# Patient Record
Sex: Female | Born: 1980 | Race: Asian | Hispanic: No | Marital: Single | Smoking: Former smoker
Health system: Southern US, Community
[De-identification: ages and names within clinical notes are randomized; demographics above are authoritative.]

## PROBLEM LIST (undated history)

## (undated) DIAGNOSIS — T7840XA Allergy, unspecified, initial encounter: Secondary | ICD-10-CM

## (undated) HISTORY — DX: Allergy, unspecified, initial encounter: T78.40XA

---

## 2013-01-04 ENCOUNTER — Encounter: Payer: Self-pay | Admitting: Internal Medicine

## 2013-01-04 ENCOUNTER — Ambulatory Visit (INDEPENDENT_AMBULATORY_CARE_PROVIDER_SITE_OTHER): Payer: BC Managed Care – PPO | Admitting: Internal Medicine

## 2013-01-04 VITALS — BP 106/70 | HR 86 | Temp 98.0°F | Resp 16 | Ht 65.0 in | Wt 126.0 lb

## 2013-01-04 DIAGNOSIS — J309 Allergic rhinitis, unspecified: Secondary | ICD-10-CM

## 2013-01-04 DIAGNOSIS — Z23 Encounter for immunization: Secondary | ICD-10-CM

## 2013-01-04 MED ORDER — FLUTICASONE PROPIONATE 50 MCG/ACT NA SUSP: 2.0000 | Freq: Every day | NASAL | Status: DC

## 2013-01-04 NOTE — Patient Instructions (Signed)
Allergic Rhinitis  Allergic rhinitis is when the mucous membranes in the nose respond to allergens. Allergens are particles in the air that cause your body to have an allergic reaction. This causes you to release allergic antibodies. Through a chain of events, these eventually cause you to release histamine into the blood stream (hence the use of antihistamines). Although meant to be protective to the body, it is this release that causes your discomfort, such as frequent sneezing, congestion and an itchy runny nose.    CAUSES    The pollen allergens may come from grasses, trees, and weeds. This is seasonal allergic rhinitis, or "hay fever." Other allergens cause year-round allergic rhinitis (perennial allergic rhinitis) such as house dust mite allergen, pet dander and mold spores.    SYMPTOMS     Nasal stuffiness (congestion).   Runny, itchy nose with sneezing and tearing of the eyes.   There is often an itching of the mouth, eyes and ears.  It cannot be cured, but it can be controlled with medications.  DIAGNOSIS    If you are unable to determine the offending allergen, skin or blood testing may find it.  TREATMENT     Avoid the allergen.   Medications and allergy shots (immunotherapy) can help.   Hay fever may often be treated with antihistamines in pill or nasal spray forms. Antihistamines block the effects of histamine. There are over-the-counter medicines that may help with nasal congestion and swelling around the eyes. Check with your caregiver before taking or giving this medicine.  If the treatment above does not work, there are many new medications your caregiver can prescribe. Stronger medications may be used if initial measures are ineffective. Desensitizing injections can be used if medications and avoidance fails. Desensitization is when a patient is given ongoing shots until the body becomes less sensitive to the allergen. Make sure you follow up with your caregiver if problems continue.   SEEK MEDICAL CARE IF:     You develop fever (more than 100.5 F (38.1 C).   You develop a cough that does not stop easily (persistent).   You have shortness of breath.   You start wheezing.   Symptoms interfere with normal daily activities.  Document Released: 05/10/2001 Document Revised: 11/07/2011 Document Reviewed: 11/19/2008  [employer] Patient Information 2013 [employer].

## 2013-01-06 NOTE — Progress Notes (Signed)
  Subjective:    Patient ID: Melanie Villa, female    DOB: 25-Oct-1980, 32 y.o.   MRN: [id number]  Allergic Reaction This is a recurrent problem. The current episode started more than 1 week ago. The problem occurs intermittently. The problem has been gradually improving since onset. The problem is mild. Pertinent negatives include no abdominal pain, chest pain, chest pressure, coughing, diarrhea, difficulty breathing, drooling, eye itching, eye redness, eye watering, globus sensation, hyperventilation, itching, rash, stridor, trouble swallowing, vomiting or wheezing. There is no swelling present. Treatments tried: allerga and flonase. The treatment provided significant relief. Her past medical history is significant for seasonal allergies.      Review of Systems  Constitutional: Negative.   HENT: Positive for congestion, rhinorrhea, sneezing and postnasal drip. Negative for hearing loss, ear pain, nosebleeds, sore throat, facial swelling, drooling, mouth sores, trouble swallowing, dental problem, voice change, sinus pressure, tinnitus and ear discharge.   Eyes: Negative.  Negative for redness and itching.  Respiratory: Negative.  Negative for cough, wheezing and stridor.   Cardiovascular: Negative.  Negative for chest pain, palpitations and leg swelling.  Gastrointestinal: Negative.  Negative for vomiting, abdominal pain and diarrhea.  Endocrine: Negative.   Genitourinary: Negative.   Musculoskeletal: Negative.   Skin: Negative.  Negative for color change, itching, pallor and rash.  Allergic/Immunologic: Negative.   Neurological: Negative.   Hematological: Negative.  Negative for adenopathy. Does not bruise/bleed easily.  Psychiatric/Behavioral: Negative.        Objective:   Physical Exam  Vitals reviewed. Constitutional: She is oriented to person, place, and time. She appears well-developed and well-nourished. No distress.  HENT:  Head: Atraumatic. No trismus in the jaw.  Nose:  Nose normal. No mucosal edema, rhinorrhea, nose lacerations, sinus tenderness, nasal deformity, septal deviation or nasal septal hematoma. No epistaxis.  No foreign bodies. Right sinus exhibits no maxillary sinus tenderness and no frontal sinus tenderness. Left sinus exhibits no maxillary sinus tenderness and no frontal sinus tenderness.  Mouth/Throat: Oropharynx is clear and moist and mucous membranes are normal. Mucous membranes are not pale, not dry and not cyanotic. No edematous. No oropharyngeal exudate, posterior oropharyngeal edema, posterior oropharyngeal erythema or tonsillar abscesses.  Eyes: Conjunctivae are normal. Right eye exhibits no discharge. Left eye exhibits no discharge. No scleral icterus.  Neck: Normal range of motion. Neck supple. No JVD present. No tracheal deviation present. No thyromegaly present.  Cardiovascular: Normal rate, regular rhythm, normal heart sounds and intact distal pulses.  Exam reveals no gallop and no friction rub.   No murmur heard. Pulmonary/Chest: Effort normal and breath sounds normal. No stridor. No respiratory distress. She has no wheezes. She has no rales. She exhibits no tenderness.  Abdominal: Soft. Bowel sounds are normal. She exhibits no distension and no mass. There is no tenderness. There is no rebound and no guarding.  Musculoskeletal: Normal range of motion. She exhibits no edema and no tenderness.  Lymphadenopathy:    She has no cervical adenopathy.  Neurological: She is oriented to person, place, and time.  Skin: Skin is warm and dry. No rash noted. She is not diaphoretic. No erythema. No pallor.  Psychiatric: She has a normal mood and affect. Her behavior is normal. Judgment and thought content normal.          Assessment & Plan:

## 2013-01-06 NOTE — Assessment & Plan Note (Signed)
Continue flonase and allegra.   

## 2013-01-15 NOTE — Addendum Note (Signed)
Addended by: Rock Nephew T on: 01/15/2013 08:27 AM   Modules accepted: Orders

## 2013-03-12 ENCOUNTER — Encounter: Payer: Self-pay | Admitting: Internal Medicine

## 2013-03-12 ENCOUNTER — Other Ambulatory Visit (INDEPENDENT_AMBULATORY_CARE_PROVIDER_SITE_OTHER): Payer: BC Managed Care – PPO

## 2013-03-12 ENCOUNTER — Ambulatory Visit (INDEPENDENT_AMBULATORY_CARE_PROVIDER_SITE_OTHER): Payer: BC Managed Care – PPO | Admitting: Internal Medicine

## 2013-03-12 VITALS — BP 112/70 | HR 64 | Temp 97.8°F | Resp 16 | Wt 121.0 lb

## 2013-03-12 DIAGNOSIS — H8309 Labyrinthitis, unspecified ear: Secondary | ICD-10-CM | POA: Insufficient documentation

## 2013-03-12 DIAGNOSIS — R42 Dizziness and giddiness: Secondary | ICD-10-CM

## 2013-03-12 LAB — COMPREHENSIVE METABOLIC PANEL
ALT: 21 U/L (ref 0–35)
AST: 20 U/L (ref 0–37)
Alkaline Phosphatase: 45 U/L (ref 39–117)
CO2: 26 mEq/L (ref 19–32)
Creatinine, Ser: 0.7 mg/dL (ref 0.4–1.2)
Sodium: 139 mEq/L (ref 135–145)
Total Bilirubin: 0.5 mg/dL (ref 0.3–1.2)
Total Protein: 7.6 g/dL (ref 6.0–8.3)

## 2013-03-12 LAB — CBC WITH DIFFERENTIAL/PLATELET
Basophils Absolute: 0 10*3/uL (ref 0.0–0.1)
HCT: 47 % — ABNORMAL HIGH (ref 36.0–46.0)
Hemoglobin: 15.9 g/dL — ABNORMAL HIGH (ref 12.0–15.0)
Lymphs Abs: 2 10*3/uL (ref 0.7–4.0)
MCHC: 33.7 g/dL (ref 30.0–36.0)
MCV: 94.3 fl (ref 78.0–100.0)
Monocytes Absolute: 0.5 10*3/uL (ref 0.1–1.0)
Neutro Abs: 4.1 10*3/uL (ref 1.4–7.7)
Platelets: 252 10*3/uL (ref 150.0–400.0)
RDW: 12.3 % (ref 11.5–14.6)

## 2013-03-12 NOTE — Patient Instructions (Signed)
Vertigo Vertigo means you feel like you or your surroundings are moving when they are not. Vertigo can be dangerous if it occurs when you are at work, driving, or performing difficult activities.  CAUSES  Vertigo occurs when there is a conflict of signals sent to your brain from the visual and sensory systems in your body. There are many different causes of vertigo, including:  Infections, especially in the inner ear.  A bad reaction to a drug or misuse of alcohol and medicines.  Withdrawal from drugs or alcohol.  Rapidly changing positions, such as lying down or rolling over in bed.  A migraine headache.  Decreased blood flow to the brain.  Increased pressure in the brain from a head injury, infection, tumor, or bleeding. SYMPTOMS  You may feel as though the world is spinning around or you are falling to the ground. Because your balance is upset, vertigo can cause nausea and vomiting. You may have involuntary eye movements (nystagmus). DIAGNOSIS  Vertigo is usually diagnosed by physical exam. If the cause of your vertigo is unknown, your caregiver may perform imaging tests, such as an MRI scan (magnetic resonance imaging). TREATMENT  Most cases of vertigo resolve on their own, without treatment. Depending on the cause, your caregiver may prescribe certain medicines. If your vertigo is related to body position issues, your caregiver may recommend movements or procedures to correct the problem. In rare cases, if your vertigo is caused by certain inner ear problems, you may need surgery. HOME CARE INSTRUCTIONS   Follow your caregiver's instructions.  Avoid driving.  Avoid operating heavy machinery.  Avoid performing any tasks that would be dangerous to you or others during a vertigo episode.  Tell your caregiver if you notice that certain medicines seem to be causing your vertigo. Some of the medicines used to treat vertigo episodes can actually make them worse in some people. SEEK  IMMEDIATE MEDICAL CARE IF:   Your medicines do not relieve your vertigo or are making it worse.  You develop problems with talking, walking, weakness, or using your arms, hands, or legs.  You develop severe headaches.  Your nausea or vomiting continues or gets worse.  You develop visual changes.  A family member notices behavioral changes.  Your condition gets worse. MAKE SURE YOU:  Understand these instructions.  Will watch your condition.  Will get help right away if you are not doing well or get worse. Document Released: 05/25/2005 Document Revised: 11/07/2011 Document Reviewed: 03/03/2011 [employer] Patient Information 2014 [employer].  

## 2013-03-12 NOTE — Assessment & Plan Note (Signed)
Her exam is normal and her symptoms are improving I think she has acute viral labrynthitis - she does not want a med for symptom relief I will check her labs to look for other secondary causes

## 2013-03-12 NOTE — Progress Notes (Signed)
Subjective:    Patient ID: Melanie Villa, female    DOB: Jan 25, 1981, 32 y.o.   MRN: [id number]  HPI  She complains of a 2 day history or dizziness and nausea, the symptoms are rapidly improving.  Review of Systems  Constitutional: Negative.  Negative for fever, chills, diaphoresis, activity change, appetite change, fatigue and unexpected weight change.  HENT: Negative for ear pain, nosebleeds, congestion, facial swelling, trouble swallowing, neck pain, neck stiffness, voice change and tinnitus.   Eyes: Negative.  Negative for photophobia and visual disturbance.  Respiratory: Negative.  Negative for cough, chest tightness, shortness of breath, wheezing and stridor.   Cardiovascular: Negative.  Negative for chest pain, palpitations and leg swelling.  Gastrointestinal: Positive for nausea. Negative for vomiting, abdominal pain, diarrhea and constipation.  Endocrine: Negative.   Musculoskeletal: Negative.  Negative for myalgias, back pain, joint swelling and gait problem.  Skin: Negative.   Allergic/Immunologic: Negative.   Neurological: Positive for dizziness. Negative for tremors, seizures, syncope, facial asymmetry, speech difficulty, weakness, light-headedness, numbness and headaches.  Hematological: Negative.  Negative for adenopathy. Does not bruise/bleed easily.  Psychiatric/Behavioral: Negative.        Objective:   Physical Exam  Vitals reviewed. Constitutional: She is oriented to person, place, and time. She appears well-developed and well-nourished.  Non-toxic appearance. She does not have a sickly appearance. She does not appear ill. No distress.  HENT:  Head: Normocephalic and atraumatic. No trismus in the jaw.  Right Ear: Hearing, tympanic membrane, external ear and ear canal normal.  Left Ear: Hearing, tympanic membrane, external ear and ear canal normal.  Nose: No mucosal edema or rhinorrhea.  Mouth/Throat: Oropharynx is clear and moist and mucous membranes are  normal. Mucous membranes are not pale, not dry and not cyanotic. No oral lesions. No edematous. No oropharyngeal exudate, posterior oropharyngeal edema, posterior oropharyngeal erythema or tonsillar abscesses.  Eyes: Conjunctivae and EOM are normal. Pupils are equal, round, and reactive to light. Right eye exhibits no discharge. Left eye exhibits no discharge. No scleral icterus.  Neck: Normal range of motion and full passive range of motion without pain. Neck supple. Normal carotid pulses, no hepatojugular reflux and no JVD present. No tracheal tenderness present. Carotid bruit is not present. No edema present. No mass and no thyromegaly present.  Cardiovascular: Normal rate, regular rhythm, normal heart sounds and intact distal pulses.  Exam reveals no gallop and no friction rub.   No murmur heard. Pulmonary/Chest: Effort normal and breath sounds normal. No respiratory distress. She has no wheezes. She has no rales. She exhibits no tenderness.  Abdominal: Soft. Bowel sounds are normal. She exhibits no distension and no mass. There is no tenderness. There is no rebound and no guarding.  Musculoskeletal: Normal range of motion. She exhibits no edema and no tenderness.  Neurological: She is alert and oriented to person, place, and time. She has normal strength. She displays no atrophy, no tremor and normal reflexes. No cranial nerve deficit or sensory deficit. She exhibits normal muscle tone. She displays a negative Romberg sign. She displays no seizure activity. Coordination and gait normal. She displays no Babinski's sign on the right side. She displays no Babinski's sign on the left side.  Reflex Scores:      Tricep reflexes are 0 on the right side and 0 on the left side.      Bicep reflexes are 0 on the right side and 0 on the left side.      Brachioradialis reflexes are 0  on the right side and 0 on the left side.      Patellar reflexes are 0 on the right side and 0 on the left side.      Achilles  reflexes are 0 on the right side and 0 on the left side. Skin: Skin is warm and dry. No rash noted. She is not diaphoretic. No erythema. No pallor.  Psychiatric: She has a normal mood and affect. Her behavior is normal. Judgment and thought content normal.     No results found for this basename: WBC, HGB, HCT, PLT, GLUCOSE, CHOL, TRIG, HDL, LDLDIRECT, LDLCALC, ALT, AST, NA, K, CL, CREATININE, BUN, CO2, TSH, PSA, INR, GLUF, HGBA1C, MICROALBUR       Assessment & Plan:

## 2013-03-27 ENCOUNTER — Ambulatory Visit (INDEPENDENT_AMBULATORY_CARE_PROVIDER_SITE_OTHER): Payer: BC Managed Care – PPO | Admitting: Internal Medicine

## 2013-03-27 ENCOUNTER — Encounter: Payer: Self-pay | Admitting: Internal Medicine

## 2013-03-27 ENCOUNTER — Other Ambulatory Visit (INDEPENDENT_AMBULATORY_CARE_PROVIDER_SITE_OTHER): Payer: BC Managed Care – PPO

## 2013-03-27 VITALS — BP 118/80 | HR 64 | Temp 98.1°F | Resp 16 | Wt 123.0 lb

## 2013-03-27 DIAGNOSIS — H8309 Labyrinthitis, unspecified ear: Secondary | ICD-10-CM

## 2013-03-27 DIAGNOSIS — D45 Polycythemia vera: Secondary | ICD-10-CM

## 2013-03-27 DIAGNOSIS — D751 Secondary polycythemia: Secondary | ICD-10-CM

## 2013-03-27 DIAGNOSIS — H8303 Labyrinthitis, bilateral: Secondary | ICD-10-CM

## 2013-03-27 LAB — CBC WITH DIFFERENTIAL/PLATELET
Basophils Absolute: 0 10*3/uL (ref 0.0–0.1)
Eosinophils Relative: 0.4 % (ref 0.0–5.0)
Lymphs Abs: 1.8 10*3/uL (ref 0.7–4.0)
Monocytes Absolute: 0.5 10*3/uL (ref 0.1–1.0)
Monocytes Relative: 7.9 % (ref 3.0–12.0)
Neutrophils Relative %: 64.5 % (ref 43.0–77.0)
Platelets: 245 10*3/uL (ref 150.0–400.0)
RDW: 12.3 % (ref 11.5–14.6)
WBC: 6.9 10*3/uL (ref 4.5–10.5)

## 2013-03-27 LAB — LACTATE DEHYDROGENASE: LDH: 115 U/L (ref 94–250)

## 2013-03-27 MED ORDER — PREDNISONE 20 MG PO TABS: 20.0000 mg | ORAL_TABLET | Freq: Two times a day (BID) | ORAL | Status: AC

## 2013-03-27 NOTE — Assessment & Plan Note (Signed)
She is improving but has some persistent symptoms She no dangerous neuro s/s Will try a short course of prednisone to treat the inner ear inflammation

## 2013-03-27 NOTE — Assessment & Plan Note (Signed)
I think this was caused by mild dehydration I will recheck her CBC today and will check her LDH and erythropoetin level to see if there is a secondary cause for this

## 2013-03-27 NOTE — Progress Notes (Signed)
Subjective:    Patient ID: Melanie Villa, female    DOB: 03/28/1981, 32 y.o.   MRN: [id number]  HPI  She returns and complains of persistent but somewhat improving dizziness and vertigo. The symptoms are worsened when she changes positions or stands up quickly but she is doing all of her normal activities and she did a long run yesterday. She does not feel poorly when she is in the bed and when she changes positions in the bed.  Review of Systems  Constitutional: Negative.  Negative for activity change.  HENT: Negative.  Negative for hearing loss, ear pain, congestion, facial swelling, drooling, trouble swallowing, neck pain, neck stiffness, dental problem and tinnitus.   Eyes: Negative.   Respiratory: Negative.   Cardiovascular: Negative.  Negative for chest pain, palpitations and leg swelling.  Gastrointestinal: Negative.   Endocrine: Negative.   Genitourinary: Negative.   Musculoskeletal: Negative.   Skin: Negative.   Allergic/Immunologic: Negative.   Neurological: Positive for dizziness. Negative for tremors, seizures, syncope, facial asymmetry, speech difficulty, weakness, light-headedness, numbness and headaches.  Hematological: Negative.   Psychiatric/Behavioral: Negative.  Negative for sleep disturbance. The patient is not nervous/anxious.        Objective:   Physical Exam  Vitals reviewed. Constitutional: She is oriented to person, place, and time. She appears well-developed and well-nourished.  Non-toxic appearance. She does not have a sickly appearance. She does not appear ill. No distress.  HENT:  Head: Normocephalic and atraumatic.  Right Ear: Hearing, tympanic membrane, external ear and ear canal normal. No decreased hearing is noted.  Left Ear: Hearing, tympanic membrane, external ear and ear canal normal.  Mouth/Throat: No oropharyngeal exudate.  Eyes: Conjunctivae are normal. Pupils are equal, round, and reactive to light. Right eye exhibits no discharge. Left  eye exhibits no discharge. No scleral icterus.  Neck: Normal range of motion. Neck supple. No JVD present. No tracheal deviation present. No thyromegaly present.  Cardiovascular: Normal rate, regular rhythm, normal heart sounds and intact distal pulses.  Exam reveals no gallop and no friction rub.   No murmur heard. Pulmonary/Chest: Effort normal and breath sounds normal. No stridor. No respiratory distress. She has no wheezes. She has no rales. She exhibits no tenderness.  Abdominal: Soft. Bowel sounds are normal. She exhibits no distension and no mass. There is no tenderness. There is no rebound and no guarding.  Musculoskeletal: Normal range of motion. She exhibits no edema and no tenderness.  Lymphadenopathy:    She has no cervical adenopathy.  Neurological: She is alert and oriented to person, place, and time. She displays no atrophy, no tremor and normal reflexes. No cranial nerve deficit or sensory deficit. She exhibits normal muscle tone. She displays a negative Romberg sign. She displays no seizure activity. Coordination and gait normal. She displays no Babinski's sign on the right side. She displays no Babinski's sign on the left side.  Reflex Scores:      Tricep reflexes are 1+ on the right side and 1+ on the left side.      Bicep reflexes are 1+ on the right side and 1+ on the left side.      Brachioradialis reflexes are 1+ on the right side and 1+ on the left side.      Patellar reflexes are 1+ on the right side and 1+ on the left side.      Achilles reflexes are 1+ on the right side and 1+ on the left side. Skin: Skin is warm and dry.  No rash noted. She is not diaphoretic. No erythema. No pallor.  Psychiatric: She has a normal mood and affect. Her speech is normal and behavior is normal. Judgment and thought content normal. Her mood appears not anxious. Her affect is not angry, not blunt, not labile and not inappropriate. Cognition and memory are normal. She does not exhibit a  depressed mood.     Lab Results  Component Value Date   WBC 6.6 03/12/2013   HGB 15.9* 03/12/2013   HCT 47.0* 03/12/2013   PLT 252.0 03/12/2013   GLUCOSE 88 03/12/2013   ALT 21 03/12/2013   AST 20 03/12/2013   NA 139 03/12/2013   K 4.5 03/12/2013   CL 107 03/12/2013   CREATININE 0.7 03/12/2013   BUN 7 03/12/2013   CO2 26 03/12/2013   TSH 2.09 03/12/2013       Assessment & Plan:

## 2013-03-27 NOTE — Patient Instructions (Signed)
Vertigo Vertigo means you feel like you or your surroundings are moving when they are not. Vertigo can be dangerous if it occurs when you are at work, driving, or performing difficult activities.  CAUSES  Vertigo occurs when there is a conflict of signals sent to your brain from the visual and sensory systems in your body. There are many different causes of vertigo, including:  Infections, especially in the inner ear.  A bad reaction to a drug or misuse of alcohol and medicines.  Withdrawal from drugs or alcohol.  Rapidly changing positions, such as lying down or rolling over in bed.  A migraine headache.  Decreased blood flow to the brain.  Increased pressure in the brain from a head injury, infection, tumor, or bleeding. SYMPTOMS  You may feel as though the world is spinning around or you are falling to the ground. Because your balance is upset, vertigo can cause nausea and vomiting. You may have involuntary eye movements (nystagmus). DIAGNOSIS  Vertigo is usually diagnosed by physical exam. If the cause of your vertigo is unknown, your caregiver may perform imaging tests, such as an MRI scan (magnetic resonance imaging). TREATMENT  Most cases of vertigo resolve on their own, without treatment. Depending on the cause, your caregiver may prescribe certain medicines. If your vertigo is related to body position issues, your caregiver may recommend movements or procedures to correct the problem. In rare cases, if your vertigo is caused by certain inner ear problems, you may need surgery. HOME CARE INSTRUCTIONS   Follow your caregiver's instructions.  Avoid driving.  Avoid operating heavy machinery.  Avoid performing any tasks that would be dangerous to you or others during a vertigo episode.  Tell your caregiver if you notice that certain medicines seem to be causing your vertigo. Some of the medicines used to treat vertigo episodes can actually make them worse in some people. SEEK  IMMEDIATE MEDICAL CARE IF:   Your medicines do not relieve your vertigo or are making it worse.  You develop problems with talking, walking, weakness, or using your arms, hands, or legs.  You develop severe headaches.  Your nausea or vomiting continues or gets worse.  You develop visual changes.  A family member notices behavioral changes.  Your condition gets worse. MAKE SURE YOU:  Understand these instructions.  Will watch your condition.  Will get help right away if you are not doing well or get worse. Document Released: 05/25/2005 Document Revised: 11/07/2011 Document Reviewed: 03/03/2011 [employer] Patient Information 2014 [employer].  

## 2013-03-28 LAB — ERYTHROPOIETIN: Erythropoietin: 9.1 m[IU]/mL (ref 2.6–18.5)

## 2013-07-04 ENCOUNTER — Other Ambulatory Visit: Payer: Self-pay

## 2014-05-15 ENCOUNTER — Encounter: Payer: Self-pay | Admitting: Internal Medicine

## 2014-05-15 ENCOUNTER — Ambulatory Visit (INDEPENDENT_AMBULATORY_CARE_PROVIDER_SITE_OTHER)
Admission: RE | Admit: 2014-05-15 | Discharge: 2014-05-15 | Disposition: A | Discharge status: Home / Self Care | Payer: BC Managed Care – PPO | Source: Ambulatory Visit | Attending: Internal Medicine | Admitting: Internal Medicine

## 2014-05-15 ENCOUNTER — Ambulatory Visit (INDEPENDENT_AMBULATORY_CARE_PROVIDER_SITE_OTHER): Payer: BC Managed Care – PPO | Admitting: Internal Medicine

## 2014-05-15 VITALS — BP 120/76 | HR 65 | Temp 98.7°F | Resp 16 | Ht 65.0 in | Wt 124.0 lb

## 2014-05-15 DIAGNOSIS — S59909A Unspecified injury of unspecified elbow, initial encounter: Secondary | ICD-10-CM

## 2014-05-15 DIAGNOSIS — S6990XA Unspecified injury of unspecified wrist, hand and finger(s), initial encounter: Secondary | ICD-10-CM

## 2014-05-15 DIAGNOSIS — M545 Low back pain, unspecified: Secondary | ICD-10-CM

## 2014-05-15 DIAGNOSIS — S6992XA Unspecified injury of left wrist, hand and finger(s), initial encounter: Secondary | ICD-10-CM

## 2014-05-15 DIAGNOSIS — S59919A Unspecified injury of unspecified forearm, initial encounter: Secondary | ICD-10-CM

## 2014-05-15 MED ORDER — NAPROXEN 375 MG PO TBEC: 1.0000 | DELAYED_RELEASE_TABLET | Freq: Two times a day (BID) | ORAL | Status: DC

## 2014-05-15 NOTE — Assessment & Plan Note (Signed)
Will start nsaids She wants to see sports medicine

## 2014-05-15 NOTE — Progress Notes (Signed)
   Subjective:    Patient ID: Melanie Villa, female    DOB: 12-06-1980, 33 y.o.   MRN: [id number]  HPI Comments: She fell 3 months ago and has recurrent pain on the dorsum of her left wrist, she has not taken anything for the pain. She also complains of chronic, intermittent low back pain and asks that I refer her to sports medicine.  Back Pain Pertinent negatives include no abdominal pain, chest pain or fever.      Review of Systems  Constitutional: Negative.  Negative for fever, chills, diaphoresis, appetite change and fatigue.  HENT: Negative.   Eyes: Negative.   Respiratory: Negative.  Negative for cough, choking, chest tightness, shortness of breath and stridor.   Cardiovascular: Negative.  Negative for chest pain, palpitations and leg swelling.  Gastrointestinal: Negative.  Negative for abdominal pain.  Endocrine: Negative.   Genitourinary: Negative.   Musculoskeletal: Positive for arthralgias and back pain. Negative for gait problem, joint swelling, myalgias, neck pain and neck stiffness.  Skin: Negative.   Allergic/Immunologic: Negative.   Neurological: Negative.   Hematological: Negative.  Negative for adenopathy. Does not bruise/bleed easily.  Psychiatric/Behavioral: Negative.        Objective:   Physical Exam  Vitals reviewed. Constitutional: She is oriented to person, place, and time. She appears well-developed and well-nourished. No distress.  HENT:  Head: Normocephalic and atraumatic.  Mouth/Throat: Oropharynx is clear and moist. No oropharyngeal exudate.  Eyes: Conjunctivae are normal. Right eye exhibits no discharge. Left eye exhibits no discharge. No scleral icterus.  Neck: Normal range of motion. Neck supple. No JVD present. No tracheal deviation present. No thyromegaly present.  Cardiovascular: Normal rate, regular rhythm, normal heart sounds and intact distal pulses.  Exam reveals no gallop and no friction rub.   No murmur heard. Pulmonary/Chest: Effort  normal and breath sounds normal. No stridor. No respiratory distress. She has no wheezes. She has no rales. She exhibits no tenderness.  Abdominal: Soft. Bowel sounds are normal. She exhibits no distension and no mass. There is no tenderness. There is no rebound and no guarding.  Musculoskeletal: Normal range of motion. She exhibits no edema and no tenderness.       Left wrist: Normal. She exhibits normal range of motion, no tenderness, no bony tenderness, no swelling, no effusion, no crepitus, no deformity and no laceration.       Lumbar back: Normal. She exhibits normal range of motion, no tenderness, no bony tenderness, no swelling, no edema, no deformity, no laceration, no pain, no spasm and normal pulse.  Lymphadenopathy:    She has no cervical adenopathy.  Neurological: She is oriented to person, place, and time.  Skin: Skin is warm and dry. No rash noted. She is not diaphoretic. No erythema. No pallor.          Assessment & Plan:

## 2014-05-15 NOTE — Assessment & Plan Note (Signed)
It appears she has tendonitis on the dorsum of her wrist Will check a plain film to look for fracture Will start nsaids She wants to see sports med as well

## 2014-05-15 NOTE — Patient Instructions (Signed)
De Quervain's Disease De Quervain's disease is a condition often seen in racquet sports where there is a soreness (inflammation) in the cord like structures (tendons) which attach muscle to bone on the thumb side of the wrist. There may be a tightening of the tissuesaround the tendons. This condition is often helped by giving up or modifying the activity which caused it. When conservative treatment does not help, surgery may be required. Conservative treatment could include changes in the activity which brought about the problem or made it worse. Anti-inflammatory medications and injections may be used to help decrease the inflammation and help with pain control. Your caregiver will help you determine which is best for you. DIAGNOSIS  Often the diagnosis (learning what is wrong) can be made by examination. Sometimes x-rays are required. HOME CARE INSTRUCTIONS   Apply ice to the sore area for 15-20 minutes, 03-04 times per day while awake. Put the ice in a plastic bag and place a towel between the bag of ice and your skin. This is especially helpful if it can be done after all activities involving the sore wrist.  Temporary splinting may help.  Only take over-the-counter or prescription medicines for pain, discomfort or fever as directed by your caregiver. SEEK MEDICAL CARE IF:   Pain relief is not obtained with medications, or if you have increasing pain and seem to be getting worse rather than better. MAKE SURE YOU:   Understand these instructions.  Will watch your condition.  Will get help right away if you are not doing well or get worse. Document Released: 05/10/2001 Document Revised: 11/07/2011 Document Reviewed: 12/18/2013 [employer] Patient Information 2015 [employer]. This information is not intended to replace advice given to you by your health care provider. Make sure you discuss any questions you have with your health care provider.  

## 2014-05-28 ENCOUNTER — Encounter: Payer: Self-pay | Admitting: Family Medicine

## 2014-05-28 ENCOUNTER — Ambulatory Visit (INDEPENDENT_AMBULATORY_CARE_PROVIDER_SITE_OTHER): Payer: BC Managed Care – PPO | Admitting: Family Medicine

## 2014-05-28 VITALS — BP 104/60 | HR 86 | Temp 98.4°F | Wt 125.4 lb

## 2014-05-28 DIAGNOSIS — S59909A Unspecified injury of unspecified elbow, initial encounter: Secondary | ICD-10-CM

## 2014-05-28 DIAGNOSIS — M999 Biomechanical lesion, unspecified: Secondary | ICD-10-CM

## 2014-05-28 DIAGNOSIS — S6990XA Unspecified injury of unspecified wrist, hand and finger(s), initial encounter: Secondary | ICD-10-CM

## 2014-05-28 DIAGNOSIS — S59919A Unspecified injury of unspecified forearm, initial encounter: Secondary | ICD-10-CM

## 2014-05-28 DIAGNOSIS — S6992XA Unspecified injury of left wrist, hand and finger(s), initial encounter: Secondary | ICD-10-CM

## 2014-05-28 DIAGNOSIS — M533 Sacrococcygeal disorders, not elsewhere classified: Secondary | ICD-10-CM

## 2014-05-28 NOTE — Assessment & Plan Note (Signed)
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 If this continues to give her difficulty after doing conservative therapy including home exercises, icing, and over-the-counter medications I would consider ultrasound for further evaluation. We'll discuss this at followup in 3 weeks.

## 2014-05-28 NOTE — Assessment & Plan Note (Signed)
Decision today to treat with OMT was based on Physical Exam  After verbal consent patient was treated with HVLA, ME techniques in cervical, thoracic, lumbar and sacral areas  Patient tolerated the procedure well with improvement in symptoms  Patient given exercises, stretches and lifestyle modifications  See medications in patient instructions if given  Patient will follow up in 3 weeks    

## 2014-05-28 NOTE — Progress Notes (Signed)
[name] [address] [place], [place] [address] Phone: [phone] Subjective:    I'm seeing this patient by the request  of:  Scarlette Calico, MD   CC: Left wrist injury  [id number] Melanie Villa is a 33 y.o. female coming in with complaint of left wrist pain after injury. Patient did fall 3 months ago. Patient states that she was having pain on the dorsal aspect of her wrist. Patient has not been taking anything for the pain. Patient states that there is a chronic intermittent sharp pain that occurs with certain activities. Patient did see primary care provider in we did get x-rays. Patient's x-rays of her wrist were unremarkable. Patient puts the severity is 4/10. Only seems to hurt when she does anything such as a pushup.  The patient is also complaining of low back pain. Patient states that this low back pain seems to be intermittent. Patient states that it is worse when she does running. Seems to be on the right side and points towards her sacroiliac joint. Denies any radiation down her leg or any numbness or tingling. Rates the severity of this is 4/10. Denies any bowel or bladder changes, any fevers or chills or any abnormal weight loss.     Past medical history, social, surgical and family history all reviewed in electronic medical record.   Review of Systems: No headache, visual changes, nausea, vomiting, diarrhea, constipation, dizziness, abdominal pain, skin rash, fevers, chills, night sweats, weight loss, swollen lymph nodes, body aches, joint swelling, muscle aches, chest pain, shortness of breath, mood changes.   Objective Blood pressure 104/60, pulse 86, temperature 98.4 F (36.9 C), temperature source Oral, weight 125 lb 6.4 oz (56.881 kg), last menstrual period 05/01/2014, SpO2 98.00%.  General: No apparent distress alert and oriented x3 mood and affect normal, dressed appropriately.  HEENT: Pupils equal, extraocular movements intact    Respiratory: Patient's speak in full sentences and does not appear short of breath  Cardiovascular: No lower extremity edema, non tender, no erythema  Skin: Warm dry intact with no signs of infection or rash on extremities or on axial skeleton.  Abdomen: Soft nontender  Neuro: Cranial nerves II through XII are intact, neurovascularly intact in all extremities with 2+ DTRs and 2+ pulses.  Lymph: No lymphadenopathy of posterior or anterior cervical chain or axillae bilaterally.  Gait normal with good balance and coordination.  MSK:  Non tender with full range of motion and good stability and symmetric strength and tone of shoulders, elbows, hip, knee and ankles bilaterally.  Wrist: Left Inspection normal with no visible erythema or swelling. ROM smooth and normal with good flexion and extension and ulnar/radial deviation that is symmetrical with opposite wrist. Mild discomfort with extension. Palpation is normal over metacarpals, navicular, lunate, and TFCC; tendons without tenderness/ swelling No snuffbox tenderness. No tenderness over Canal of Guyon. Strength 5/5 in all directions without pain. Negative Finkelstein, tinel's and phalens. Questionable positive Watson's test. Contralateral wrist unremarkable  Back Exam:  Inspection: Unremarkable  Motion: Flexion 45 deg, Extension 45 deg, Side Bending to 45 deg bilaterally,  Rotation to 45 deg bilaterally  SLR laying: Negative  XSLR laying: Negative  Palpable tenderness: Tender over her right SI joint FABER: Mildly positive right Sensory change: Gross sensation intact to all lumbar and sacral dermatomes.  Reflexes: 2+ at both patellar tendons, 2+ at achilles tendons, Babinski's downgoing.  Strength at foot  Plantar-flexion: 5/5 Dorsi-flexion: 5/5 Eversion: 5/5 Inversion: 5/5  Leg strength  Quad: 5/5 Hamstring: 5/5 Hip flexor: 5/5 Hip abductors: 5/5  Gait unremarkable.  OMT Physical Exam   Standing flexion right  Seated Flexion  right  Cervical  C2 flexed rotated and side bent left C5 flexed rotated inside that right  Thoracic T1 and extended rotated and side bent left T3 extended rotated and side bent right  Lumbar L2 flexed rotated and side bent right  Sacrum Left on left      Impression and Recommendations:     This case required medical decision making of moderate complexity.

## 2014-05-28 NOTE — Patient Instructions (Signed)
Very nice to see you Ice 20 minutes 2 times daily. Usually after activity and before bed. Sacroiliac Joint Mobilization and Rehab 1. Work on pretzel stretching, shoulder back and leg draped in front. 3-5 sets, 30 sec.. 2. hip abductor rotations. standing, hip flexion and rotation outward then inward. 3 sets, 15 reps. when can do comfortably, add ankle weights starting at 2 pounds.  3. cross over stretching - shoulder back to ground, same side leg crossover. 3-5 sets for 30 min..  4. rolling up and back knees to chest and rocking. 5. sacral tilt - 5 sets, hold for 5-10 seconds Exercises on wall.  Heel and butt touching.  Raise leg 6 inches and hold 2 seconds.  Down slow for count of 4 seconds.  1 set of 30 reps daily on both sides.  Try stetches after running in handout as well.  Vitamin D 2000 IU dialy to help wrist heal. Avoid extension with exercises.  Turmeric 500mg  twice daily Come back in 3 weeks.

## 2014-05-28 NOTE — Assessment & Plan Note (Signed)
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 We discussed that this is secondary to the muscle imbalances, tight hip flexors as well as the weak hip abductor's. Patient was given exercises to do a regular basis as well as an icing: Over-the-counter medications that could be beneficial. We discussed the importance of stretching after such things as running. Patient can try these interventions and come back and see me again in 3-4 weeks for further evaluation. Patient did respond well to osteopathic manipulation today.

## 2014-05-28 NOTE — Progress Notes (Signed)
Pre visit review using our clinic review tool, if applicable. No additional management support is needed unless otherwise documented below in the visit note. 

## 2014-06-18 ENCOUNTER — Encounter: Payer: Self-pay | Admitting: Family Medicine

## 2014-06-18 ENCOUNTER — Ambulatory Visit (INDEPENDENT_AMBULATORY_CARE_PROVIDER_SITE_OTHER): Payer: BC Managed Care – PPO | Admitting: Family Medicine

## 2014-06-18 VITALS — BP 98/66 | HR 78 | Ht 65.0 in | Wt 127.0 lb

## 2014-06-18 DIAGNOSIS — M9904 Segmental and somatic dysfunction of sacral region: Secondary | ICD-10-CM

## 2014-06-18 DIAGNOSIS — S6992XD Unspecified injury of left wrist, hand and finger(s), subsequent encounter: Secondary | ICD-10-CM

## 2014-06-18 DIAGNOSIS — M9902 Segmental and somatic dysfunction of thoracic region: Secondary | ICD-10-CM

## 2014-06-18 DIAGNOSIS — M533 Sacrococcygeal disorders, not elsewhere classified: Secondary | ICD-10-CM

## 2014-06-18 DIAGNOSIS — M999 Biomechanical lesion, unspecified: Secondary | ICD-10-CM

## 2014-06-18 DIAGNOSIS — M9903 Segmental and somatic dysfunction of lumbar region: Secondary | ICD-10-CM

## 2014-06-18 NOTE — Patient Instructions (Signed)
I had to do some work again.  Continue daily exercises.  Work on sleeping position not with hand up!!!! Continue the vitamins For your wrist taping just proximal to the joint.  See me again in 3 weeks.

## 2014-06-18 NOTE — Progress Notes (Signed)
[facility] [place], [place] [address] Phone: [phone] Subjective:     CC: Left wrist injury sacroiliac joint dysfunction followup  [id number] Melanie Villa is a 33 y.o. female coming in with complaint of left wrist pain after injury. Patient was found to have a subluxation of the lunate bone. Patient states it is feeling much better. Patient continues to work out without any significant pain. Patient has not the exercises on a regular basis. Patient states that it is approximately 85% better with some mild pain with extension still.  The patient is also complaining of low back pain. Patient was found to have sacroiliac joint dysfunction. Patient did have manipulation done at last appointment and states that she was doing significantly better up to the last few days. Patient states when she does a significant amount of rotation it hurts. Not doing the exercises regularly. He is taking vitamins. No new symptoms.    Past medical history, social, surgical and family history all reviewed in electronic medical record.   Review of Systems: No headache, visual changes, nausea, vomiting, diarrhea, constipation, dizziness, abdominal pain, skin rash, fevers, chills, night sweats, weight loss, swollen lymph nodes, body aches, joint swelling, muscle aches, chest pain, shortness of breath, mood changes.   Objective Blood pressure 98/66, pulse 78, height 5\' 5"  (1.651 m), weight 127 lb (57.607 kg), SpO2 97.00%.  General: No apparent distress alert and oriented x3 mood and affect normal, dressed appropriately.  HEENT: Pupils equal, extraocular movements intact  Respiratory: Patient's speak in full sentences and does not appear short of breath  Cardiovascular: No lower extremity edema, non tender, no erythema  Skin: Warm dry intact with no signs of infection or rash on extremities or on axial skeleton.  Abdomen: Soft nontender  Neuro: Cranial nerves II  through XII are intact, neurovascularly intact in all extremities with 2+ DTRs and 2+ pulses.  Lymph: No lymphadenopathy of posterior or anterior cervical chain or axillae bilaterally.  Gait normal with good balance and coordination.  MSK:  Non tender with full range of motion and good stability and symmetric strength and tone of shoulders, elbows, hip, knee and ankles bilaterally.  Wrist: Left Inspection normal with no visible erythema or swelling. ROM smooth and normal with good flexion and extension and ulnar/radial deviation that is symmetrical with opposite wrist. Mild discomfort with extension but significantly improved from previous exam Palpation is normal over metacarpals, navicular, lunate, and TFCC; tendons without tenderness/ swelling No snuffbox tenderness. No tenderness over Canal of Guyon. Strength 5/5 in all directions without pain. Negative Finkelstein, tinel's and phalens. Questionable positive Watson's test. Contralateral wrist unremarkable  Back Exam:  Inspection: Unremarkable  Motion: Flexion 45 deg, Extension 45 deg, Side Bending to 45 deg bilaterally,  Rotation to 45 deg bilaterally  SLR laying: Negative  XSLR laying: Negative  Palpable tenderness: Tender over her right SI joint FABER: Mildly positive right Sensory change: Gross sensation intact to all lumbar and sacral dermatomes.  Reflexes: 2+ at both patellar tendons, 2+ at achilles tendons, Babinski's downgoing.  Strength at foot  Plantar-flexion: 5/5 Dorsi-flexion: 5/5 Eversion: 5/5 Inversion: 5/5  Leg strength  Quad: 5/5 Hamstring: 5/5 Hip flexor: 5/5 Hip abductors: 5/5  Gait unremarkable.  OMT Physical Exam   Standing flexion right  Seated Flexion right  Cervical  C2 flexed rotated and side bent left C5 flexed rotated inside that right  Thoracic T1 and extended rotated and side bent left elevated first rib left  T3 extended rotated and side bent right  Lumbar L2 flexed rotated and side bent  right  Sacrum Left on left      Impression and Recommendations:     This case required medical decision making of moderate complexity.

## 2014-06-18 NOTE — Assessment & Plan Note (Signed)
Patient is doing better. Patient was taught how to splint wrist with exercise and allow her to increase her activity. We discussed icing after the activity. Encourage doing the home exercises 3 times a week for the next 6 weeks. Patient and will come back and see me again in 3 weeks.

## 2014-06-18 NOTE — Assessment & Plan Note (Signed)
Decision today to treat with OMT was based on Physical Exam  After verbal consent patient was treated with HVLA, ME techniques in cervical, thoracic, lumbar and sacral areas  Patient tolerated the procedure well with improvement in symptoms  Patient given exercises, stretches and lifestyle modifications  See medications in patient instructions if given  Patient will follow up in 3 weeks    

## 2014-06-18 NOTE — Assessment & Plan Note (Signed)
Patient's pain is likely still secondary to the sacrum iliac dysfunction. I do not think it is of significant concern. We discussed icing regimen, home exercises and doing is more religiously, as well as core strengthening exercises. Discuss different changes in the technique the patient is using. Patient will come back and see me again in 3 weeks for further evaluation and treatment.

## 2014-07-09 ENCOUNTER — Encounter: Payer: Self-pay | Admitting: Family Medicine

## 2014-07-09 ENCOUNTER — Ambulatory Visit (INDEPENDENT_AMBULATORY_CARE_PROVIDER_SITE_OTHER): Payer: BC Managed Care – PPO | Admitting: Internal Medicine

## 2014-07-09 ENCOUNTER — Ambulatory Visit (INDEPENDENT_AMBULATORY_CARE_PROVIDER_SITE_OTHER): Payer: BC Managed Care – PPO | Admitting: Family Medicine

## 2014-07-09 VITALS — BP 122/78 | HR 64 | Temp 98.8°F | Ht 65.0 in | Wt 127.0 lb

## 2014-07-09 DIAGNOSIS — M9903 Segmental and somatic dysfunction of lumbar region: Secondary | ICD-10-CM

## 2014-07-09 DIAGNOSIS — M533 Sacrococcygeal disorders, not elsewhere classified: Secondary | ICD-10-CM

## 2014-07-09 DIAGNOSIS — J301 Allergic rhinitis due to pollen: Secondary | ICD-10-CM

## 2014-07-09 DIAGNOSIS — M9902 Segmental and somatic dysfunction of thoracic region: Secondary | ICD-10-CM

## 2014-07-09 DIAGNOSIS — M9904 Segmental and somatic dysfunction of sacral region: Secondary | ICD-10-CM

## 2014-07-09 NOTE — Patient Instructions (Addendum)
Good to see you Stand on wall for 5 minutes daily Tennis ball between shoulder blades with sitting at computer Monitor at eye level Continue the exercises for you back.  Whey protein isolate 20 grams daily especially 30 minutes after exercise.  I still need to see you 3-4 weeks.

## 2014-07-09 NOTE — Assessment & Plan Note (Signed)
Decision today to treat with OMT was based on Physical Exam  After verbal consent patient was treated with HVLA, ME techniques in cervical, thoracic, lumbar and sacral areas  Patient tolerated the procedure well with improvement in symptoms  Patient given exercises, stretches and lifestyle modifications  See medications in patient instructions if given  Patient will follow up in 4 weeks   

## 2014-07-09 NOTE — Assessment & Plan Note (Addendum)
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 I do think that some of it is still secondary to weakness of the hip abductor's as well as tightness of the hamstrings, discussed other exercises. Discussed OTC medications.   Decision today to treat with OMT was based on Physical Exam  After verbal consent patient was treated with HVLA, ME techniques in cervical, thoracic, lumbar and sacral areas  Patient tolerated the procedure well with improvement in symptoms  Patient given exercises, stretches and lifestyle modifications  See medications in patient instructions if given  Patient will follow up in 3-4 weeks

## 2014-07-09 NOTE — Progress Notes (Signed)
Pre visit review using our clinic review tool, if applicable. No additional management support is needed unless otherwise documented below in the visit note. 

## 2014-07-09 NOTE — Progress Notes (Signed)
  [facility] [address] [place], [place] [address] Phone: [phone] Subjective:     CC: Left wrist injury sacroiliac joint dysfunction followup  [id number] Melanie Villa is a 33 y.o. female coming in with complaint of left wrist pain after injury. Pain is completely resolved   Patient continues to have low back pain. Patient has been diagnosed with sacroiliac dysfunction. Patient has been more religious on the exercises and doing the over-the-counter medications. Patient has been doing more exercise classes and been trying to work on Editor, commissioning. Patient has not notice any significant improvement. Patient states maybe the pain at night has been better. Denies any new symptoms.    Past medical history, social, surgical and family history all reviewed in electronic medical record.   Review of Systems: No headache, visual changes, nausea, vomiting, diarrhea, constipation, dizziness, abdominal pain, skin rash, fevers, chills, night sweats, weight loss, swollen lymph nodes, body aches, joint swelling, muscle aches, chest pain, shortness of breath, mood changes.   Objective Blood pressure 122/78, pulse 64, temperature 98.8 F (37.1 C), height 5\' 5"  (1.651 m), weight 127 lb (57.607 kg), SpO2 98 %.  General: No apparent distress alert and oriented x3 mood and affect normal, dressed appropriately.  HEENT: Pupils equal, extraocular movements intact  Respiratory: Patient's speak in full sentences and does not appear short of breath  Cardiovascular: No lower extremity edema, non tender, no erythema  Skin: Warm dry intact with no signs of infection or rash on extremities or on axial skeleton.  Abdomen: Soft nontender  Neuro: Cranial nerves II through XII are intact, neurovascularly intact in all extremities with 2+ DTRs and 2+ pulses.  Lymph: No lymphadenopathy of posterior or anterior cervical chain or axillae bilaterally.  Gait normal with good balance  and coordination.  MSK:  Non tender with full range of motion and good stability and symmetric strength and tone of shoulders, elbows, hip, knee and ankles bilaterally.  Wrist: Left Inspection normal with no visible erythema or swelling. ROM smooth and normal with good flexion and extension and ulnar/radial deviation that is symmetrical with opposite wrist. Mild discomfort with extension but significantly improved from previous exam Palpation is normal over metacarpals, navicular, lunate, and TFCC; tendons without tenderness/ swelling No snuffbox tenderness. No tenderness over Canal of Guyon. Strength 5/5 in all directions without pain. Negative Finkelstein, tinel's and phalens. Questionable positive Watson's test. Contralateral wrist unremarkable  Back Exam:  Inspection: Unremarkable  Motion: Flexion 35 deg, Extension 45 deg, Side Bending to 45 deg bilaterally,  Rotation to 45 deg bilaterally  SLR laying: Negative  XSLR laying: Negative  Palpable tenderness: Tender over her right SI joint FABER: Mildly positive right Sensory change: Gross sensation intact to all lumbar and sacral dermatomes.  Reflexes: 2+ at both patellar tendons, 2+ at achilles tendons, Babinski's downgoing.  Strength at foot  Plantar-flexion: 5/5 Dorsi-flexion: 5/5 Eversion: 5/5 Inversion: 5/5  Leg strength  Quad: 5/5 Hamstring: 5/5 Hip flexor: 5/5 Hip abductors: 4/5  Gait unremarkable.  OMT Physical Exam  Standing flexion right  Seated Flexion right  Cervical  C2 flexed rotated and side bent left C5 flexed rotated inside that right  Thoracic T1 and extended rotated and side bent left elevated first rib left  Lumbar L2 flexed rotated and side bent right  Sacrum Left on left   Impression and Recommendations:     This case required medical decision making of moderate complexity.

## 2014-07-10 NOTE — Progress Notes (Signed)
   Subjective:    Patient ID: Melanie Villa, female    DOB: [birth year], 33 y.o.   MRN: [id number]  HPI    Review of Systems     Objective:   Physical Exam    Not seen    Assessment & Plan:

## 2014-08-06 ENCOUNTER — Ambulatory Visit (INDEPENDENT_AMBULATORY_CARE_PROVIDER_SITE_OTHER): Payer: BC Managed Care – PPO | Admitting: Family Medicine

## 2014-08-06 ENCOUNTER — Encounter: Payer: Self-pay | Admitting: Family Medicine

## 2014-08-06 VITALS — BP 98/68 | HR 85 | Ht 65.0 in | Wt 127.0 lb

## 2014-08-06 DIAGNOSIS — M9902 Segmental and somatic dysfunction of thoracic region: Secondary | ICD-10-CM

## 2014-08-06 DIAGNOSIS — M533 Sacrococcygeal disorders, not elsewhere classified: Secondary | ICD-10-CM

## 2014-08-06 DIAGNOSIS — M9903 Segmental and somatic dysfunction of lumbar region: Secondary | ICD-10-CM

## 2014-08-06 DIAGNOSIS — M9904 Segmental and somatic dysfunction of sacral region: Secondary | ICD-10-CM

## 2014-08-06 DIAGNOSIS — M999 Biomechanical lesion, unspecified: Secondary | ICD-10-CM

## 2014-08-06 NOTE — Assessment & Plan Note (Signed)
Decision today to treat with OMT was based on Physical Exam  After verbal consent patient was treated with HVLA, ME techniques in cervical, thoracic, lumbar and sacral areas  Patient tolerated the procedure well with improvement in symptoms  Patient given exercises, stretches and lifestyle modifications  See medications in patient instructions if given  Patient will follow up in 5 weeks

## 2014-08-06 NOTE — Patient Instructions (Addendum)
You are smart on the loans.  Conitnue the exercises 3 times a week.  COntinue the protein because you are doing great!!! Lets see you in 5 weeks.

## 2014-08-06 NOTE — Progress Notes (Signed)
  [facility] [place], [place] [address] Phone: [phone] Subjective:     CC:  sacroiliac joint dysfunction followup  [id number] Melanie Villa is a 33 y.o. female coming in with complaint of  Low back pain.   Patient continues to have low back pain. Patient has been diagnosed with sacroiliac dysfunction. Patient has been more religious on the exercises and doing the over-the-counter medications.  Patient started doing protein supplementation and states that the severity of the pain is significantly less. Patient is also been able to work out more frequently. Patient is very happy with these results. Patient states that this started having more of a dull aching sensation again. Not having any pain going down the legs. No pain that keeps her from sleep at this time.    Past medical history, social, surgical and family history all reviewed in electronic medical record.   Review of Systems: No headache, visual changes, nausea, vomiting, diarrhea, constipation, dizziness, abdominal pain, skin rash, fevers, chills, night sweats, weight loss, swollen lymph nodes, body aches, joint swelling, muscle aches, chest pain, shortness of breath, mood changes.   Objective Blood pressure 98/68, pulse 85, height 5\' 5"  (1.651 m), weight 127 lb (57.607 kg), SpO2 95 %.  General: No apparent distress alert and oriented x3 mood and affect normal, dressed appropriately.  HEENT: Pupils equal, extraocular movements intact  Respiratory: Patient's speak in full sentences and does not appear short of breath  Cardiovascular: No lower extremity edema, non tender, no erythema  Skin: Warm dry intact with no signs of infection or rash on extremities or on axial skeleton.  Abdomen: Soft nontender  Neuro: Cranial nerves II through XII are intact, neurovascularly intact in all extremities with 2+ DTRs and 2+ pulses.  Lymph: No lymphadenopathy of posterior or anterior cervical  chain or axillae bilaterally.  Gait normal with good balance and coordination.  MSK:  Non tender with full range of motion and good stability and symmetric strength and tone of shoulders, elbows, hip, knee and ankles bilaterally.   Back Exam:  Inspection: Unremarkable  Motion: Flexion 35 deg, Extension 45 deg, Side Bending to 45 deg bilaterally,  Rotation to 45 deg bilaterally  SLR laying: Negative  XSLR laying: Negative  Palpable tenderness: Tender over her right SI joint FABER:  Negative which is an improvement Sensory change: Gross sensation intact to all lumbar and sacral dermatomes.  Reflexes: 2+ at both patellar tendons, 2+ at achilles tendons, Babinski's downgoing.  Strength at foot  Plantar-flexion: 5/5 Dorsi-flexion: 5/5 Eversion: 5/5 Inversion: 5/5  Leg strength  Quad: 5/5 Hamstring: 5/5 Hip flexor: 5/5 Hip abductors: 4/5  Gait unremarkable.  OMT Physical Exam  Standing flexion right  Seated Flexion right  Cervical  C2 flexed rotated and side bent left  Thoracic T1 and extended rotated and side bent left elevated first rib left  Lumbar L2 flexed rotated and side bent right  L4 flexed rotated and side bent left  Sacrum Left on left   Impression and Recommendations:     This case required medical decision making of moderate complexity.

## 2014-08-06 NOTE — Assessment & Plan Note (Signed)
Patient overall is doing much better. We discussed continuing the home exercises and doing it on a regular basis. We discussed core strengthening and hip abductor strengthening and will be beneficial. We discussed that during the holidays it is normal to miss some exercises and not to become too frustrated. Patient with continue with the over-the-counter medications. We discussed continuing the protein supplementation as well. Patient will come back and see me again in 3-4 weeks for further evaluation and treatment.

## 2014-09-08 ENCOUNTER — Ambulatory Visit: Payer: BC Managed Care – PPO | Admitting: Family Medicine

## 2015-06-30 ENCOUNTER — Encounter: Payer: Self-pay | Admitting: Internal Medicine

## 2015-06-30 ENCOUNTER — Ambulatory Visit (INDEPENDENT_AMBULATORY_CARE_PROVIDER_SITE_OTHER): Payer: 59 | Admitting: Internal Medicine

## 2015-06-30 ENCOUNTER — Other Ambulatory Visit (INDEPENDENT_AMBULATORY_CARE_PROVIDER_SITE_OTHER): Payer: 59

## 2015-06-30 VITALS — BP 98/72 | HR 82 | Temp 98.2°F | Resp 16 | Ht 65.0 in | Wt 122.0 lb

## 2015-06-30 DIAGNOSIS — R10816 Epigastric abdominal tenderness: Secondary | ICD-10-CM

## 2015-06-30 DIAGNOSIS — K589 Irritable bowel syndrome without diarrhea: Secondary | ICD-10-CM | POA: Diagnosis not present

## 2015-06-30 LAB — HCG, QUANTITATIVE, PREGNANCY: QUANTITATIVE HCG: 0.21 m[IU]/mL

## 2015-06-30 LAB — COMPREHENSIVE METABOLIC PANEL
ALT: 12 U/L (ref 0–35)
AST: 14 U/L (ref 0–37)
Albumin: 4.1 g/dL (ref 3.5–5.2)
Alkaline Phosphatase: 52 U/L (ref 39–117)
BILIRUBIN TOTAL: 0.4 mg/dL (ref 0.2–1.2)
BUN: 11 mg/dL (ref 6–23)
CALCIUM: 9 mg/dL (ref 8.4–10.5)
CHLORIDE: 105 meq/L (ref 96–112)
CO2: 28 meq/L (ref 19–32)
Creatinine, Ser: 0.79 mg/dL (ref 0.40–1.20)
GFR: 89.5 mL/min (ref >60.00)
GLUCOSE: 84 mg/dL (ref 70–99)
Potassium: 4.8 mEq/L (ref 3.5–5.1)
Sodium: 139 mEq/L (ref 135–145)
Total Protein: 7.4 g/dL (ref 6.0–8.3)

## 2015-06-30 LAB — CBC WITH DIFFERENTIAL/PLATELET
BASOS ABS: 0 10*3/uL (ref 0.0–0.1)
BASOS PCT: 0.7 % (ref 0.0–3.0)
Eosinophils Absolute: 0 10*3/uL (ref 0.0–0.7)
Eosinophils Relative: 0.6 % (ref 0.0–5.0)
HEMATOCRIT: 45 % (ref 36.0–46.0)
Hemoglobin: 15.1 g/dL — ABNORMAL HIGH (ref 12.0–15.0)
LYMPHS ABS: 1.4 10*3/uL (ref 0.7–4.0)
LYMPHS PCT: 25.2 % (ref 12.0–46.0)
MCHC: 33.6 g/dL (ref 30.0–36.0)
MCV: 94.7 fl (ref 78.0–100.0)
MONOS PCT: 7.7 % (ref 3.0–12.0)
Monocytes Absolute: 0.4 10*3/uL (ref 0.1–1.0)
NEUTROS ABS: 3.7 10*3/uL (ref 1.4–7.7)
NEUTROS PCT: 65.8 % (ref 43.0–77.0)
PLATELETS: 273 10*3/uL (ref 150.0–400.0)
RBC: 4.75 Mil/uL (ref 3.87–5.11)
RDW: 12.6 % (ref 11.5–15.5)
WBC: 5.6 10*3/uL (ref 4.0–10.5)

## 2015-06-30 LAB — URINALYSIS, ROUTINE W REFLEX MICROSCOPIC
Bilirubin Urine: NEGATIVE
HGB URINE DIPSTICK: NEGATIVE
Ketones, ur: NEGATIVE
Leukocytes, UA: NEGATIVE
Nitrite: NEGATIVE
RBC / HPF: NONE SEEN (ref 0–?)
SPECIFIC GRAVITY, URINE: 1.02 (ref 1.000–1.030)
Total Protein, Urine: 30 — AB
URINE GLUCOSE: NEGATIVE
Urobilinogen, UA: 0.2 (ref 0.0–1.0)
pH: 7.5 (ref 5.0–8.0)

## 2015-06-30 LAB — LIPASE: LIPASE: 10 U/L — AB (ref 11.0–59.0)

## 2015-06-30 LAB — AMYLASE: Amylase: 41 U/L (ref 27–131)

## 2015-06-30 MED ORDER — HYOSCYAMINE SULFATE ER 0.375 MG PO TB12
0.3750 mg | ORAL_TABLET | Freq: Two times a day (BID) | ORAL | Status: DC
Start: 2015-06-30 — End: 2016-11-09

## 2015-06-30 NOTE — Progress Notes (Signed)
Subjective:  Patient ID: Melanie Villa, female    DOB: 10/18/1980  Age: 34 y.o. MRN: [id number]  CC: Abdominal Pain   HPI Melanie Villa presents for 3 month history of epigastric abdominal pain. She describes herself as having intestinal issues for nearly a decade which sounds like irritable bowel syndrome. She has alternating constipation and diarrhea. More recently she has had a dull ache in her epigastrium that is made a little worse after eating a meal. Her only risk factor for upper GI pathology is that she occasionally takes a dose of an anti-inflammatory. She denies any heartburn, odynophagia or dysphagia. She has had no episodes of weight loss or loss of appetite.  Outpatient Prescriptions Prior to Visit  Medication Sig Dispense Refill  . JUNEL FE 1/20 1-20 MG-MCG tablet   0   No facility-administered medications prior to visit.    ROS Review of Systems  Constitutional: Negative.  Negative for fever, chills, diaphoresis, activity change, appetite change, fatigue and unexpected weight change.  HENT: Negative.  Negative for sore throat, trouble swallowing and voice change.   Eyes: Negative.   Respiratory: Negative.  Negative for cough, choking, chest tightness, shortness of breath and stridor.   Cardiovascular: Negative.  Negative for chest pain, palpitations and leg swelling.  Gastrointestinal: Positive for abdominal pain, diarrhea and constipation. Negative for nausea, vomiting, blood in stool, abdominal distention, anal bleeding and rectal pain.  Endocrine: Negative.   Genitourinary: Negative.  Negative for dysuria, urgency, frequency, hematuria, flank pain, decreased urine volume, vaginal bleeding, difficulty urinating and vaginal pain.  Musculoskeletal: Negative.  Negative for myalgias, back pain, joint swelling and arthralgias.  Skin: Negative.  Negative for rash.  Allergic/Immunologic: Negative.   Neurological: Negative.  Negative for dizziness and light-headedness.    Hematological: Negative.  Negative for adenopathy. Does not bruise/bleed easily.  Psychiatric/Behavioral: Negative.     Objective:  BP 98/72 mmHg  Pulse 82  Temp(Src) 98.2 F (36.8 C) (Oral)  Resp 16  Ht 5\' 5"  (1.651 m)  Wt 122 lb (55.339 kg)  BMI 20.30 kg/m2  SpO2 99%  LMP 06/28/2015  BP Readings from Last 3 Encounters:  06/30/15 98/72  08/06/14 98/68  07/09/14 122/78    Wt Readings from Last 3 Encounters:  06/30/15 122 lb (55.339 kg)  08/06/14 127 lb (57.607 kg)  07/09/14 127 lb (57.607 kg)    Physical Exam  Constitutional: She is oriented to person, place, and time. She appears well-developed and well-nourished.  Non-toxic appearance. She does not have a sickly appearance. She does not appear ill. No distress.  HENT:  Head: Normocephalic and atraumatic.  Mouth/Throat: Oropharynx is clear and moist. No oropharyngeal exudate.  Eyes: Conjunctivae are normal. Right eye exhibits no discharge. Left eye exhibits no discharge. No scleral icterus.  Neck: Normal range of motion. Neck supple. No JVD present. No tracheal deviation present. No thyromegaly present.  Cardiovascular: Normal rate, normal heart sounds and intact distal pulses.  Exam reveals no gallop and no friction rub.   No murmur heard. Pulmonary/Chest: Effort normal and breath sounds normal. No stridor. No respiratory distress. She has no wheezes. She has no rales. She exhibits no tenderness.  Abdominal: Soft. Bowel sounds are normal. She exhibits no distension and no mass. There is no tenderness. There is no rebound and no guarding.  Genitourinary: Rectum normal. Rectal exam shows no external hemorrhoid, no internal hemorrhoid, no fissure, no mass, no tenderness and anal tone normal. Guaiac negative stool.  Musculoskeletal: Normal range of motion.  She exhibits no edema.  Lymphadenopathy:    She has no cervical adenopathy.  Neurological: She is oriented to person, place, and time.  Skin: Skin is warm and dry. No  rash noted. She is not diaphoretic. No erythema. No pallor.    Lab Results  Component Value Date   WBC 5.6 06/30/2015   HGB 15.1* 06/30/2015   HCT 45.0 06/30/2015   PLT 273.0 06/30/2015   GLUCOSE 84 06/30/2015   ALT 12 06/30/2015   AST 14 06/30/2015   NA 139 06/30/2015   K 4.8 06/30/2015   CL 105 06/30/2015   CREATININE 0.79 06/30/2015   BUN 11 06/30/2015   CO2 28 06/30/2015   TSH 2.09 03/12/2013    Dg Wrist Complete Left  05/15/2014  CLINICAL DATA:  Lateral wrist pain EXAM: LEFT WRIST - COMPLETE 3+ VIEW COMPARISON:  None. FINDINGS: Four views of left wrist submitted. No acute fracture or subluxation. No radiopaque foreign body. IMPRESSION: Negative. Electronically Signed   By: Lahoma Crocker M.D.   On: 05/15/2014 16:49    Assessment & Plan:   Melanie Villa was seen today for abdominal pain.  Diagnoses and all orders for this visit:  Epigastric abdominal tenderness without rebound tenderness- her labs are normal, there is no evidence of anemia, pancreatitis, hepatitis or renal disease. Her pregnancy test is negative. Will start treating her for irritable bowel sound with Levbid. Will also get an ultrasound to screen her for gallbladder disease. If none of this is revealing and her pain continues then will consider getting a CT scan. -     Lipase; Future -     Comprehensive metabolic panel; Future -     CBC with Differential/Platelet; Future -     Amylase; Future -     Urinalysis, Routine w reflex microscopic (not at [facility]); Future -     hCG, quantitative, pregnancy; Future -     US Abdomen Complete; Future  IBS (irritable bowel syndrome)- will treat with levbid -     hyoscyamine (LEVBID) 0.375 MG 12 hr tablet; Take 1 tablet (0.375 mg total) by mouth 2 (two) times daily.   I am having Melanie Villa start on hyoscyamine. I am also having her maintain her JUNEL FE 1/20.  Meds ordered this encounter  Medications  . hyoscyamine (LEVBID) 0.375 MG 12 hr tablet    Sig: Take 1 tablet (0.375  mg total) by mouth 2 (two) times daily.    Dispense:  60 tablet    Refill:  2     Follow-up: Return in about 3 weeks (around 07/21/2015).  Melanie Calico, MD

## 2015-06-30 NOTE — Patient Instructions (Signed)

## 2015-06-30 NOTE — Progress Notes (Signed)
Pre visit review using our clinic review tool, if applicable. No additional management support is needed unless otherwise documented below in the visit note. 

## 2015-07-10 ENCOUNTER — Ambulatory Visit
Admission: RE | Admit: 2015-07-10 | Discharge: 2015-07-10 | Disposition: A | Discharge status: Home / Self Care | Payer: 59 | Source: Ambulatory Visit | Attending: Internal Medicine | Admitting: Internal Medicine

## 2015-07-10 DIAGNOSIS — R10816 Epigastric abdominal tenderness: Secondary | ICD-10-CM

## 2015-07-11 ENCOUNTER — Encounter: Payer: Self-pay | Admitting: Internal Medicine

## 2015-08-18 ENCOUNTER — Ambulatory Visit: Payer: 59 | Admitting: Osteopathic Medicine

## 2015-09-18 ENCOUNTER — Encounter: Payer: Self-pay | Admitting: Family Medicine

## 2015-09-18 ENCOUNTER — Ambulatory Visit (INDEPENDENT_AMBULATORY_CARE_PROVIDER_SITE_OTHER): Payer: 59 | Admitting: Family Medicine

## 2015-09-18 ENCOUNTER — Ambulatory Visit (INDEPENDENT_AMBULATORY_CARE_PROVIDER_SITE_OTHER)
Admission: RE | Admit: 2015-09-18 | Discharge: 2015-09-18 | Disposition: A | Discharge status: Home / Self Care | Payer: 59 | Source: Ambulatory Visit | Attending: Family Medicine | Admitting: Family Medicine

## 2015-09-18 VITALS — BP 106/72 | HR 73 | Ht 65.0 in | Wt 127.0 lb

## 2015-09-18 DIAGNOSIS — M545 Low back pain, unspecified: Secondary | ICD-10-CM

## 2015-09-18 DIAGNOSIS — M9904 Segmental and somatic dysfunction of sacral region: Secondary | ICD-10-CM

## 2015-09-18 DIAGNOSIS — M533 Sacrococcygeal disorders, not elsewhere classified: Secondary | ICD-10-CM | POA: Diagnosis not present

## 2015-09-18 DIAGNOSIS — M9905 Segmental and somatic dysfunction of pelvic region: Secondary | ICD-10-CM

## 2015-09-18 DIAGNOSIS — M999 Biomechanical lesion, unspecified: Secondary | ICD-10-CM

## 2015-09-18 DIAGNOSIS — M9903 Segmental and somatic dysfunction of lumbar region: Secondary | ICD-10-CM

## 2015-09-18 NOTE — Assessment & Plan Note (Signed)
Decision today to treat with OMT was based on Physical Exam  After verbal consent patient was treated with HVLA, ME, FPR techniques in thoracic, lumbar and sacral as well as pelvic areas  Patient tolerated the procedure well with improvement in symptoms  Patient given exercises, stretches and lifestyle modifications  See medications in patient instructions if given  Patient will follow up in 3-4 weeks

## 2015-09-18 NOTE — Progress Notes (Signed)
Pre visit review using our clinic review tool, if applicable. No additional management support is needed unless otherwise documented below in the visit note. 

## 2015-09-18 NOTE — Progress Notes (Signed)
[name] [address], [place] [address] Phone: [phone] Subjective:     CC:  sacroiliac joint dysfunction followup  [id number] Melanie Villa is a 35 y.o. female coming in with complaint of  Low back pain.  Has been greater than a year since we've seen patient. Patient states that she was doing very well. Started having increasing pain as well as discomfort. Patient states that it seems to be little when she puts pressure on the area. Patient has been working out more as well as running. An states that it does not hurt with running. Seems to be only some mild tightness in the area after running. Patient has been trying to do the exercises regularly. Hasn't drained ibuprofen but tries not to take it regularly   Past Medical History  Diagnosis Date  . Allergy    No past surgical history on file. Social History  Substance Use Topics  . Smoking status: Former Research scientist (life sciences)  . Smokeless tobacco: Never Used  . Alcohol Use: 3.0 oz/week    5 Glasses of wine per week   Allergies  Allergen Reactions  . Amoxicillin    Family History  Problem Relation Age of Onset  . Hypertension Mother   . Alcohol abuse Neg Hx   . Asthma Neg Hx   . Cancer Neg Hx   . COPD Neg Hx   . Depression Neg Hx   . Diabetes Neg Hx   . Drug abuse Neg Hx   . Early death Neg Hx   . Hearing loss Neg Hx   . Heart disease Neg Hx   . Hyperlipidemia Neg Hx   . Kidney disease Neg Hx   . Stroke Neg Hx        Past medical history, social, surgical and family history all reviewed in electronic medical record.   Review of Systems: No headache, visual changes, nausea, vomiting, diarrhea, constipation, dizziness, abdominal pain, skin rash, fevers, chills, night sweats, weight loss, swollen lymph nodes, body aches, joint swelling, muscle aches, chest pain, shortness of breath, mood changes.   Objective Blood pressure 106/72, pulse 73, height 5\' 5"  (1.651 m), weight 127 lb  (57.607 kg), SpO2 97 %.  General: No apparent distress alert and oriented x3 mood and affect normal, dressed appropriately.  HEENT: Pupils equal, extraocular movements intact  Respiratory: Patient's speak in full sentences and does not appear short of breath  Cardiovascular: No lower extremity edema, non tender, no erythema  Skin: Warm dry intact with no signs of infection or rash on extremities or on axial skeleton.  Abdomen: Soft nontender  Neuro: Cranial nerves II through XII are intact, neurovascularly intact in all extremities with 2+ DTRs and 2+ pulses.  Lymph: No lymphadenopathy of posterior or anterior cervical chain or axillae bilaterally.  Gait normal with good balance and coordination.  MSK:  Non tender with full range of motion and good stability and symmetric strength and tone of shoulders, elbows, hip, knee and ankles bilaterally.   Back Exam:  Inspection: Unremarkable  Motion: Flexion 35 deg, Extension 45 deg, Side Bending to 45 deg bilaterally,  Rotation to 45 deg bilaterally  SLR laying: Negative  XSLR laying: Negative  Palpable tenderness: Tender over her right SI joint FABER:  Positive bilaterally significant tightness of the hip girdle Sensory change: Gross sensation intact to all lumbar and sacral dermatomes.  Reflexes: 2+ at both patellar tendons, 2+ at achilles tendons, Babinski's downgoing.  Strength at foot  Plantar-flexion:  5/5 Dorsi-flexion: 5/5 Eversion: 5/5 Inversion: 5/5  Leg strength  Quad: 5/5 Hamstring: 5/5 Hip flexor: 5/5 Hip abductors: 4/5  Gait unremarkable.  OMT Physical Exam  Standing flexion right  Seated Flexion right  Cervical  C2 flexed rotated and side bent left  Thoracic T1 and extended rotated and side bent left elevated first rib left  Lumbar L2 flexed rotated and side bent right  L4 flexed rotated and side bent left  Sacrum Bilateral forward flexion of the sacrum   Impression and Recommendations:     This case required  medical decision making of moderate complexity.

## 2015-09-18 NOTE — Patient Instructions (Signed)
Good to see you Ice 20 minutes 2 times daily. Usually after activity and before bed. pennsaid pinkie amount topically 2 times daily as needed.  Keep doing what you are doiing and add yoga 1-2 times a week Xray downstairs Work more on rotation with your stretching and I try not to fall over ;) Continue with the protein See me again in 3 weeks and we will check your gait and make sure we are making progress

## 2015-09-18 NOTE — Assessment & Plan Note (Signed)
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 I do think that patient has had the pain long enough that we should get an x-ray to rule out any other bony abdomen could be contribute in. Patient does have significant tightness of the pelvic girdle. Discussed different home exercises and showed proper technique today. We discussed continuing the vitamin D and we discussed other activities that could be more beneficial. We discussed the importance of stretching after running. Patient and will come back and see me again in 3 weeks for further evaluation and treatment.

## 2015-10-23 ENCOUNTER — Ambulatory Visit: Payer: Self-pay | Admitting: Family Medicine

## 2015-10-26 ENCOUNTER — Ambulatory Visit: Payer: Self-pay | Admitting: Family Medicine

## 2015-11-04 ENCOUNTER — Other Ambulatory Visit (INDEPENDENT_AMBULATORY_CARE_PROVIDER_SITE_OTHER): Payer: 59

## 2015-11-04 ENCOUNTER — Encounter: Payer: Self-pay | Admitting: Family Medicine

## 2015-11-04 ENCOUNTER — Ambulatory Visit (INDEPENDENT_AMBULATORY_CARE_PROVIDER_SITE_OTHER): Payer: 59 | Admitting: Family Medicine

## 2015-11-04 VITALS — BP 102/70 | HR 77 | Ht 65.0 in | Wt 124.0 lb

## 2015-11-04 DIAGNOSIS — M9904 Segmental and somatic dysfunction of sacral region: Secondary | ICD-10-CM

## 2015-11-04 DIAGNOSIS — M25562 Pain in left knee: Secondary | ICD-10-CM

## 2015-11-04 DIAGNOSIS — M9905 Segmental and somatic dysfunction of pelvic region: Secondary | ICD-10-CM

## 2015-11-04 DIAGNOSIS — M999 Biomechanical lesion, unspecified: Secondary | ICD-10-CM

## 2015-11-04 DIAGNOSIS — M6752 Plica syndrome, left knee: Secondary | ICD-10-CM

## 2015-11-04 DIAGNOSIS — M9903 Segmental and somatic dysfunction of lumbar region: Secondary | ICD-10-CM

## 2015-11-04 DIAGNOSIS — M533 Sacrococcygeal disorders, not elsewhere classified: Secondary | ICD-10-CM

## 2015-11-04 NOTE — Patient Instructions (Signed)
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  Leg lifts when sitting Wall sits 30 seconds 3-4 times a day  No running and biking in the same day for now Your back is great keep it up  vimovo 2 times a day for next 3 day s See me again in 3 weeks.   Plica Syndrome With Rehab Plicae exist in approximately 60% of adults. They are folds in the joint lining (synovial tissue) that are remnant from development of the joint. These folds occur most commonly in the knee. Most individuals do not experience any adverse symptoms due to the presence of a plica. If a plica becomes thickened or inflamed, then it may cause symptoms. SYMPTOMS   Pain in the knee joint, usually greatest in the front (anterior) portion.  Pain that worsens with kneeling, squatting, or walking up/down stairs.  Catching, locking, and/or clicking of the knee. CAUSES  You are born with them (congenital). Plica syndrome is caused by irritation to the plicae. Irritation may be from injury or overuse in sports.  RISK INCREASES WITH:   Activities that include repetitive stress placed on the knee (kicking or jumping).  Previous knee injury.  Activities in which trauma to the knee is likely (volleyball, soccer, or football). PREVENTION  Use properly fitted and padded protective equipment.  Warm up and stretch properly before activity.  Allow for adequate recovery between workouts.  Maintain physical fitness:  Strength, flexibility, and endurance.  Cardiovascular fitness. PROGNOSIS  If treated properly, then the symptoms of plica syndrome usually resolve. RELATED COMPLICATIONS   Recurrent symptoms that result in a chronic problem.  Inability to compete in athletics.  Prolonged healing time, if improperly treated.  Risks of surgery: infection, bleeding, nerve damage, or damage to surrounding tissues. TREATMENT Treatment  initially involves the use of ice and medication to help reduce pain and inflammation. The use of strengthening and stretching exercises may help reduce pain with activity, specifically exercises involving the hamstring and quadriceps muscles. These exercises may be performed at home or with referral to a therapist. Your caregiver may recommend a corticosteroid injection to help reduce inflammation. For individuals with flat feet, an arch support may be recommended. If pain persists for greater than 6 months of nonsurgical (conservative) treatment, then surgery may be recommended to remove the plica.  MEDICATION   If pain medication is necessary, then nonsteroidal anti-inflammatory medications, such as aspirin and ibuprofen, or other minor pain relievers, such as acetaminophen, are often recommended.  Do not take pain medication for 7 days before surgery.  Prescription pain relievers may be given if deemed necessary by your caregiver. Use only as directed and only as much as you need.  Corticosteroid injections may be given by your caregiver. These injections should be reserved for the most serious cases because they may only be given a certain number of times. HEAT AND COLD  Cold treatment (icing) relieves pain and reduces inflammation. Cold treatment should be applied for 10 to 15 minutes every 2 to 3 hours for inflammation and pain and immediately after any activity that aggravates your symptoms. Use ice packs or massage the area with a piece of ice (ice massage).  Heat treatment may be used prior to performing the stretching and strengthening activities prescribed by your caregiver, physical therapist, or athletic trainer. Use a heat pack or soak the injury in warm water. SEEK  IMMEDIATE MEDICAL CARE IF:  Treatment seems to offer no benefit, or the condition worsens.  Any medications produce adverse side effects. EXERCISES RANGE OF MOTION (ROM) AND STRETCHING EXERCISES - Plica Syndrome These  exercises may help you when beginning to rehabilitate your injury. Your symptoms may resolve with or without further involvement from your physician, physical therapist or athletic trainer. While completing these exercises, remember:   Restoring tissue flexibility helps normal motion to return to the joints. This allows healthier, less painful movement and activity.  An effective stretch should be held for at least 30 seconds.  A stretch should never be painful. You should only feel a gentle lengthening or release in the stretched tissue. STRETCH - Quadriceps, Prone  Lie on your stomach on a firm surface, such as a bed or padded floor.  Bend your right / left knee and grasp your ankle. If you are unable to reach, your ankle or pant leg, use a belt around your foot to lengthen your reach.  Gently pull your heel toward your buttocks. Your knee should not slide out to the side. You should feel a stretch in the front of your thigh and/or knee.  Hold this position for __________ seconds. Repeat __________ times. Complete this stretch __________ times per day.  STRETCH - Hamstrings, Supine   Lie on your back. Loop a belt or towel over the ball of your right / left foot.  Straighten your right / left knee and slowly pull on the belt to raise your leg. Do not allow the right / left knee to bend. Keep your opposite leg flat on the floor.  Raise the leg until you feel a gentle stretch behind your right / left knee or thigh. Hold this position for __________ seconds. Repeat __________ times. Complete this stretch __________ times per day.  STRETCH - Hamstrings, Doorway  Lie on your back with your right / left leg extended and resting on the wall and the opposite leg flat on the ground through the door. Initially, position your bottom farther away from the wall.  Keep your right / left knee straight. If you feel a stretch behind your knee or thigh, hold this position for __________ seconds.  If  you do not feel a stretch, scoot your bottom closer to the door and hold __________ seconds. Repeat __________ times. Complete this stretch __________ times per day.  STRETCH - Iliotibial Band  On the floor or bed, lie on your side so your right / left leg is on top. Bend your knee and grab your ankle.  Slowly bring your knee back so that your thigh is in line with your trunk. Keep your heel at your buttocks and gently arch your back so your head, shoulders and hips line up.  Slowly lower your leg so that your knee approaches the floor/bed until you feel a gentle stretch on the outside of your right / left thigh. If you do not feel a stretch and your knee will not fall farther, place the heel of your opposite foot on top of your knee and pull your thigh down farther.  Hold this stretch for __________ seconds. Repeat __________ times. Complete this stretch __________ times per day. STRETCH - Hamstrings, Standing  Stand or sit and extend your right / left leg, placing your foot on a chair or foot stool  Keeping a slight arch in your low back and your hips straight forward.  Lead with your chest and lean forward at the waist  until you feel a gentle stretch in the back of your right / left knee or thigh. (When done correctly, this exercise requires leaning only a small distance.)  Hold this position for __________ seconds. Repeat __________ times. Complete this stretch __________ times per day. STRENGTHENING EXERCISES - Plica Syndrome  These exercises may help you when beginning to rehabilitate your injury. They may resolve your symptoms with or without further involvement from your physician, physical therapist or athletic trainer. While completing these exercises, remember:   Muscles can gain both the endurance and the strength needed for everyday activities through controlled exercises.  Complete these exercises as instructed by your physician, physical therapist or athletic trainer.  Progress the resistance and repetitions only as guided. STRENGTH - Quadriceps, Isometrics  Lie on your back with your right / left leg extended and your opposite knee bent.  Gradually tense the muscles in the front of your right / left thigh. You should see either your knee cap slide up toward your hip or increased dimpling just above the knee. This motion will push the back of the knee down toward the floor/mat/bed on which you are lying.  Hold the muscle as tight as you can without increasing your pain for __________ seconds.  Relax the muscles slowly and completely in between each repetition. Repeat __________ times. Complete this exercise __________ times per day.  STRENGTH - Quadriceps, Short Arcs   Lie on your back. Place a __________ inch towel roll under your knee so that the knee slightly bends.  Raise only your lower leg by tightening the muscles in the front of your thigh. Do not allow your thigh to rise.  Hold this position for __________ seconds. Repeat __________ times. Complete this exercise __________ times per day.  OPTIONAL ANKLE WEIGHTS: Begin with ____________________, but DO NOT exceed ____________________. Increase in 1 lb/0.5 kg increments. STRENGTH - Quadriceps, Straight Leg Raises  Quality counts! Watch for signs that the quadriceps muscle is working to insure you are strengthening the correct muscles and not "cheating" by substituting with healthier muscles.  Lay on your back with your right / left leg extended and your opposite knee bent.  Tense the muscles in the front of your right / left thigh. You should see either your knee cap slide up or increased dimpling just above the knee. Your thigh may even quiver.  Tighten these muscles even more and raise your leg 4 to 6 inches off the floor. Hold for __________ seconds.  Keeping these muscles tense, lower your leg.  Relax the muscles slowly and completely in between each repetition. Repeat __________ times.  Complete this exercise __________ times per day.  STRENGTH - Quadriceps, Step-Ups   Use a thick book, step or step stool that is __________ inches tall.  Holding a wall or counter for balance only, not support.  Slowly step-up with your right / left foot, keeping your knee in line with your hip and foot. Do not allow your knee to bend so far that you cannot see your toes.  Slowly unlock your knee and lower yourself to the starting position. Your muscles, not gravity, should lower you. Repeat __________ times. Complete this exercise __________ times per day.  STRENGTH - Quad/VMO, Isometric   Sit in a chair with your right / left knee slightly bent. With your fingertips, feel the VMO muscle just above the inside of your knee. The VMO is important in controlling the position of your kneecap.  Keeping your fingertips on this  muscle. Without actually moving your leg, attempt to drive your knee down as if straightening your leg. You should feel your VMO tense. If you have a difficult time, you may wish to try the same exercise on your healthy knee first.  Tense this muscle as hard as you can without increasing any knee pain.  Hold for __________ seconds. Relax the muscles slowly and completely in between each repetition. Repeat __________ times. Complete exercise __________ times per day.    This information is not intended to replace advice given to you by your health care provider. Make sure you discuss any questions you have with your health care provider.   Document Released: 08/15/2005 Document Revised: 12/30/2014 Document Reviewed: 11/27/2008 Elsevier Interactive Patient Education [employer].

## 2015-11-04 NOTE — Assessment & Plan Note (Signed)
Decision today to treat with OMT was based on Physical Exam  After verbal consent patient was treated with HVLA, ME, FPR techniques in thoracic, lumbar and sacral as well as pelvic areas  Patient tolerated the procedure well with improvement in symptoms  Patient given exercises, stretches and lifestyle modifications  See medications in patient instructions if given  Patient will follow up in 3-4 weeks

## 2015-11-04 NOTE — Assessment & Plan Note (Signed)
Back his been doing significantly better at this time. We discussed continuing to watch the range of motion. Patient will work on core strengthening as well as the hip abductors. We discussed what activities to which was potentially avoid. Patient with continue running but we discuss crosstraining. Patient come back and see me again in 3-4 weeks.

## 2015-11-04 NOTE — Progress Notes (Signed)
Pre visit review using our clinic review tool, if applicable. No additional management support is needed unless otherwise documented below in the visit note. 

## 2015-11-04 NOTE — Progress Notes (Signed)
[facility] [address] [place], [place] [address] Phone: [phone] Subjective:     CC:  sacroiliac joint dysfunction followup  [id number] Melanie Villa is a 35 y.o. female coming in with complaint of  Low back pain.  Patient overall has been doing very well. The exercises regularly. Scapholunate minimal discomfort. As long as she continues to do the exercises regularly she is not having as much pain.   Patient was coming in with a new problem. Having more of a left knee pain. States that hurts more on the medial aspect of the knee. States that it seems to be worse with running. Rates the severity of 7 out of 10. Patient states that seems to get better when she is not running but does give her some trouble with stairs. Does not remember any true injury. Was increasing her running to 9 mile runs from time to time.    Past Medical History  Diagnosis Date  . Allergy    No past surgical history on file. Social History  Substance Use Topics  . Smoking status: Former Research scientist (life sciences)  . Smokeless tobacco: Never Used  . Alcohol Use: 3.0 oz/week    5 Glasses of wine per week   Allergies  Allergen Reactions  . Amoxicillin    Family History  Problem Relation Age of Onset  . Hypertension Mother   . Alcohol abuse Neg Hx   . Asthma Neg Hx   . Cancer Neg Hx   . COPD Neg Hx   . Depression Neg Hx   . Diabetes Neg Hx   . Drug abuse Neg Hx   . Early death Neg Hx   . Hearing loss Neg Hx   . Heart disease Neg Hx   . Hyperlipidemia Neg Hx   . Kidney disease Neg Hx   . Stroke Neg Hx        Past medical history, social, surgical and family history all reviewed in electronic medical record.   Review of Systems: No headache, visual changes, nausea, vomiting, diarrhea, constipation, dizziness, abdominal pain, skin rash, fevers, chills, night sweats, weight loss, swollen lymph nodes, body aches, joint swelling, muscle aches, chest pain, shortness of breath, mood  changes.   Objective Blood pressure 102/70, pulse 77, height 5\' 5"  (1.651 m), weight 124 lb (56.246 kg), SpO2 97 %.  General: No apparent distress alert and oriented x3 mood and affect normal, dressed appropriately.  HEENT: Pupils equal, extraocular movements intact  Respiratory: Patient's speak in full sentences and does not appear short of breath  Cardiovascular: No lower extremity edema, non tender, no erythema  Skin: Warm dry intact with no signs of infection or rash on extremities or on axial skeleton.  Abdomen: Soft nontender  Neuro: Cranial nerves II through XII are intact, neurovascularly intact in all extremities with 2+ DTRs and 2+ pulses.  Lymph: No lymphadenopathy of posterior or anterior cervical chain or axillae bilaterally.  Gait normal with good balance and coordination.  MSK:  Non tender with full range of motion and good stability and symmetric strength and tone of shoulders, elbows, hip, and ankles bilaterally.   Knee:left  Normal to inspection with no erythema or effusion or obvious bony abnormalities. Tender to palpation over the superior medial aspect of the patellaseems to have a plica and noted. ROM full in flexion and extension and lower leg rotation. Ligaments with solid consistent endpoints including ACL, PCL, LCL, MCL. Negative Mcmurray's, Apley's, and Thessalonian tests. mild painful patellar  compression. Patellar glide without crepitus. Patellar and quadriceps tendons unremarkable. Hamstring and quadriceps strength is normal.  Contralateral knee unremarkable  Back Exam:  Inspection: Unremarkable  Motion: Flexion 35 deg, Extension 35 deg, Side Bending to 45 deg bilaterally,  Rotation to 45 deg bilaterally  SLR laying: Negative  XSLR laying: Negative  Palpable tenderness:  nontender on exam today. FABER:   Negative today Sensory change: Gross sensation intact to all lumbar and sacral dermatomes.  Reflexes: 2+ at both patellar tendons, 2+ at achilles  tendons, Babinski's downgoing.  Strength at foot  Plantar-flexion: 5/5 Dorsi-flexion: 5/5 Eversion: 5/5 Inversion: 5/5  Leg strength  Quad: 5/5 Hamstring: 5/5 Hip flexor: 5/5 Hip abductors: 4/5  Gait unremarkable.  OMT Physical Exam  Standing flexion right  Seated Flexion right  Cervical  C2 flexed rotated and side bent left  Thoracic T1 and extended rotated and side bent left elevated first rib left  Lumbar L2 flexed rotated and side bent right  L4 flexed rotated and side bent left  Sacrum Bilateral forward flexion of the sacrum   Impression and Recommendations:     This case required medical decision making of moderate complexity.

## 2015-11-04 NOTE — Assessment & Plan Note (Signed)
Topical anti-inflammatories given. Icing given. We discussed home exercises with activities to avoid. We discussed if needed we may need to consider injection at follow-up. Will return visit in 4 weeks.

## 2015-11-25 ENCOUNTER — Encounter: Payer: Self-pay | Admitting: Family Medicine

## 2015-11-25 ENCOUNTER — Ambulatory Visit (INDEPENDENT_AMBULATORY_CARE_PROVIDER_SITE_OTHER): Payer: 59 | Admitting: Family Medicine

## 2015-11-25 VITALS — BP 112/76 | HR 66 | Wt 125.0 lb

## 2015-11-25 DIAGNOSIS — M999 Biomechanical lesion, unspecified: Secondary | ICD-10-CM

## 2015-11-25 DIAGNOSIS — M6752 Plica syndrome, left knee: Secondary | ICD-10-CM

## 2015-11-25 DIAGNOSIS — M9905 Segmental and somatic dysfunction of pelvic region: Secondary | ICD-10-CM | POA: Diagnosis not present

## 2015-11-25 DIAGNOSIS — M533 Sacrococcygeal disorders, not elsewhere classified: Secondary | ICD-10-CM

## 2015-11-25 DIAGNOSIS — M9903 Segmental and somatic dysfunction of lumbar region: Secondary | ICD-10-CM

## 2015-11-25 DIAGNOSIS — M9904 Segmental and somatic dysfunction of sacral region: Secondary | ICD-10-CM | POA: Diagnosis not present

## 2015-11-25 NOTE — Assessment & Plan Note (Signed)
Decision today to treat with OMT was based on Physical Exam  After verbal consent patient was treated with HVLA, ME, FPR techniques in thoracic, lumbar and sacral as well as pelvic areas  Patient tolerated the procedure well with improvement in symptoms  Patient given exercises, stretches and lifestyle modifications  See medications in patient instructions if given  Patient will follow up in 4 weeks

## 2015-11-25 NOTE — Assessment & Plan Note (Signed)
Overall patient is doing very well. We discussed with patient to continue with the range of motion and core strengthening. Patient is working on her hip abductors which is making her a more efficient runner as well. Patient come back and see me again in 4-6 weeks for further evaluation

## 2015-11-25 NOTE — Progress Notes (Signed)
[facility] [address] [place], [place] [address] Phone: [phone] Subjective:     CC:  sacroiliac joint dysfunction followup  [id number] Melanie Villa is a 35 y.o. female coming in with complaint of  Low back pain.  Has been doing well. Mild tightness from time to time but nothing that stops her from activity. Doing the home exercises regularly. Working on [employer], advertising. Does respond well to osteopathic manipulation.  Left knee pain. 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 States that she had been doing the exercises and has been getting better. Did a 10 mile race and then had this on a plane for 4 hours. When she went to get up she had significant pain again. This was 2 days ago and it is slowly getting better. Did respond to anti-inflammatories. No instability noted. Has another race in 6 weeks. Still running approximate 30 miles a week.    Past Medical History  Diagnosis Date  . Allergy    No past surgical history on file. Social History  Substance Use Topics  . Smoking status: Former Research scientist (life sciences)  . Smokeless tobacco: Never Used  . Alcohol Use: 3.0 oz/week    5 Glasses of wine per week   Allergies  Allergen Reactions  . Amoxicillin    Family History  Problem Relation Age of Onset  . Hypertension Mother   . Alcohol abuse Neg Hx   . Asthma Neg Hx   . Cancer Neg Hx   . COPD Neg Hx   . Depression Neg Hx   . Diabetes Neg Hx   . Drug abuse Neg Hx   . Early death Neg Hx   . Hearing loss Neg Hx   . Heart disease Neg Hx   . Hyperlipidemia Neg Hx   . Kidney disease Neg Hx   . Stroke Neg Hx        Past medical history, social, surgical and family history all reviewed in electronic medical record.   Review of Systems: No headache, visual changes, nausea, vomiting, diarrhea, constipation, dizziness, abdominal pain, skin rash, fevers, chills, night sweats, weight loss, swollen lymph nodes, body aches, joint swelling,  muscle aches, chest pain, shortness of breath, mood changes.   Objective Blood pressure 112/76, pulse 66, weight 125 lb (56.7 kg).  General: No apparent distress alert and oriented x3 mood and affect normal, dressed appropriately.  HEENT: Pupils equal, extraocular movements intact  Respiratory: Patient's speak in full sentences and does not appear short of breath  Cardiovascular: No lower extremity edema, non tender, no erythema  Skin: Warm dry intact with no signs of infection or rash on extremities or on axial skeleton.  Abdomen: Soft nontender  Neuro: Cranial nerves II through XII are intact, neurovascularly intact in all extremities with 2+ DTRs and 2+ pulses.  Lymph: No lymphadenopathy of posterior or anterior cervical chain or axillae bilaterally.  Gait normal with good balance and coordination.  MSK:  Non tender with full range of motion and good stability and symmetric strength and tone of shoulders, elbows, hip, and ankles bilaterally.   Knee:left  Normal to inspection with no erythema or effusion or obvious bony abnormalities. Tender to palpation over the superior medial aspect of the patellaseems to have a plica. ROM full in flexion and extension and lower leg rotation. Ligaments with solid consistent endpoints including ACL, PCL, LCL, MCL. Negative Mcmurray's, Apley's, and Thessalonian tests. mild painful patellar compression. Patellar glide without crepitus. Patellar and  quadriceps tendons unremarkable. Hamstring and quadriceps strength is normal.  Contralateral knee unremarkable No change from previous exam  Back Exam:  Inspection: Unremarkable  Motion: Flexion 35 deg, Extension 35 deg, Side Bending to 45 deg bilaterally,  Rotation to 45 deg bilaterally  SLR laying: Negative  XSLR laying: Negative  Palpable tenderness:  nontender on exam today. FABER:   Negative today Sensory change: Gross sensation intact to all lumbar and sacral dermatomes.  Reflexes: 2+ at both  patellar tendons, 2+ at achilles tendons, Babinski's downgoing.  Strength at foot  Plantar-flexion: 5/5 Dorsi-flexion: 5/5 Eversion: 5/5 Inversion: 5/5  Leg strength  Quad: 5/5 Hamstring: 5/5 Hip flexor: 5/5 Hip abductors: 4/5  Gait unremarkable.  OMT Physical Exam   Cervical  C2 flexed rotated and side bent left   Thoracic T1 and extended rotated and side bent left elevated first rib left  Lumbar L2 flexed rotated and side bent right  Sacrum Right on right   Impression and Recommendations:     This case required medical decision making of moderate complexity.

## 2015-11-25 NOTE — Assessment & Plan Note (Signed)
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 Differential also includes meniscal injury but none was seen on ultrasound previously. We did did discuss with patient again at great length. Patient elected to continue with conservative therapy. Patient won't take any anti-inflammatory as needed. We discussed icing regimen. Discussed which activities to avoid. Patient will be following up again in 4 weeks and see how she was responding.

## 2015-11-25 NOTE — Patient Instructions (Signed)
Always good to see yo u Ice after running and before bed.  Naproxen after long runs.  Keep doing what you are doing for your back  See me again after the [place] Race!

## 2016-01-14 ENCOUNTER — Encounter: Payer: Self-pay | Admitting: Family Medicine

## 2016-01-14 ENCOUNTER — Ambulatory Visit (INDEPENDENT_AMBULATORY_CARE_PROVIDER_SITE_OTHER): Payer: 59 | Admitting: Family Medicine

## 2016-01-14 VITALS — BP 108/68 | HR 63 | Ht 65.0 in | Wt 126.0 lb

## 2016-01-14 DIAGNOSIS — M9904 Segmental and somatic dysfunction of sacral region: Secondary | ICD-10-CM | POA: Diagnosis not present

## 2016-01-14 DIAGNOSIS — M9905 Segmental and somatic dysfunction of pelvic region: Secondary | ICD-10-CM | POA: Diagnosis not present

## 2016-01-14 DIAGNOSIS — M6752 Plica syndrome, left knee: Secondary | ICD-10-CM

## 2016-01-14 DIAGNOSIS — M999 Biomechanical lesion, unspecified: Secondary | ICD-10-CM

## 2016-01-14 DIAGNOSIS — M533 Sacrococcygeal disorders, not elsewhere classified: Secondary | ICD-10-CM | POA: Diagnosis not present

## 2016-01-14 DIAGNOSIS — M9903 Segmental and somatic dysfunction of lumbar region: Secondary | ICD-10-CM

## 2016-01-14 NOTE — Progress Notes (Signed)
[facility] [place], [place] [address] Phone: [phone] Subjective:     CC:  sacroiliac joint dysfunction followup  [id number] Melanie Villa is a 35 y.o. female coming in with complaint of  Low back pain.  Sent have more of a sacroiliac joint dysfunction. Doing better overall. Just Back from doing a marathon. Did do very well but is having some mild increase in lower back pain. Was able to finish the race. Rates the discomfort is 6 out of 10 at its worse but now close to 2 out of 10.  Left knee pain. 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 Was able to do the brace with no problems. No swelling. No locking or giving out on her. Very happy with the results.    Past Medical History  Diagnosis Date  . Allergy    No past surgical history on file. Social History  Substance Use Topics  . Smoking status: Former Research scientist (life sciences)  . Smokeless tobacco: Never Used  . Alcohol Use: 3.0 oz/week    5 Glasses of wine per week   Allergies  Allergen Reactions  . Amoxicillin    Family History  Problem Relation Age of Onset  . Hypertension Mother   . Alcohol abuse Neg Hx   . Asthma Neg Hx   . Cancer Neg Hx   . COPD Neg Hx   . Depression Neg Hx   . Diabetes Neg Hx   . Drug abuse Neg Hx   . Early death Neg Hx   . Hearing loss Neg Hx   . Heart disease Neg Hx   . Hyperlipidemia Neg Hx   . Kidney disease Neg Hx   . Stroke Neg Hx        Past medical history, social, surgical and family history all reviewed in electronic medical record.   Review of Systems: No headache, visual changes, nausea, vomiting, diarrhea, constipation, dizziness, abdominal pain, skin rash, fevers, chills, night sweats, weight loss, swollen lymph nodes, body aches, joint swelling, muscle aches, chest pain, shortness of breath, mood changes.   Objective Blood pressure 108/68, pulse 63, weight 126 lb (57.153 kg), SpO2 98 %.  General: No apparent distress alert and  oriented x3 mood and affect normal, dressed appropriately.  HEENT: Pupils equal, extraocular movements intact  Respiratory: Patient's speak in full sentences and does not appear short of breath  Cardiovascular: No lower extremity edema, non tender, no erythema  Skin: Warm dry intact with no signs of infection or rash on extremities or on axial skeleton.  Abdomen: Soft nontender  Neuro: Cranial nerves II through XII are intact, neurovascularly intact in all extremities with 2+ DTRs and 2+ pulses.  Lymph: No lymphadenopathy of posterior or anterior cervical chain or axillae bilaterally.  Gait normal with good balance and coordination.  MSK:  Non tender with full range of motion and good stability and symmetric strength and tone of shoulders, elbows, hip, and ankles bilaterally.   Knee:left  Normal to inspection with no erythema or effusion or obvious bony abnormalities. nontender on exam today. ROM full in flexion and extension and lower leg rotation. Ligaments with solid consistent endpoints including ACL, PCL, LCL, MCL. Negative Mcmurray's, Apley's, and Thessalonian tests. mild painful patellar compressionstill remaining. Patellar glide without crepitus. Patellar and quadriceps tendons unremarkable. Hamstring and quadriceps strength is normal.  Contralateral knee unremarkable Mild improvement from previous exam.  Back Exam:  Inspection: Unremarkable  Motion: Flexion 35 deg, Extension  35 deg, Side Bending to 45 deg bilaterally,  Rotation to 45 deg bilaterally  SLR laying: Negative  XSLR laying: Negative  Palpable tenderness:  nontender on exam today. FABER:   Negative today Sensory change: Gross sensation intact to all lumbar and sacral dermatomes.  Reflexes: 2+ at both patellar tendons, 2+ at achilles tendons, Babinski's downgoing.  Strength at foot  Plantar-flexion: 5/5 Dorsi-flexion: 5/5 Eversion: 5/5 Inversion: 5/5  Leg strength  Quad: 5/5 Hamstring: 5/5 Hip flexor: 5/5 Hip  abductors: 4+/5  Gait unremarkable.  OMT Physical Exam  Cervical  C2 flexed rotated and side bent left C4 flexed rotated and side bent right  Thoracic T1 and extended rotated and side bent left elevated first rib left T3 extended rotated and side bent right  Lumbar L2 flexed rotated and side bent right  Sacrum Right on right   Impression and Recommendations:     This case required medical decision making of moderate complexity.

## 2016-01-14 NOTE — Assessment & Plan Note (Signed)
Decision today to treat with OMT was based on Physical Exam  After verbal consent patient was treated with HVLA, ME, FPR techniques in thoracic, lumbar and sacral as well as pelvic areas  Patient tolerated the procedure well with improvement in symptoms  Patient given exercises, stretches and lifestyle modifications  See medications in patient instructions if given  Patient will follow up in 8 weeks

## 2016-01-14 NOTE — Assessment & Plan Note (Signed)
Has responded to conservative therapy. Follow-up as needed.

## 2016-01-14 NOTE — Patient Instructions (Signed)
You are awesome  you in 2-3 months

## 2016-01-14 NOTE — Progress Notes (Signed)
Pre visit review using our clinic review tool, if applicable. No additional management support is needed unless otherwise documented below in the visit note. 

## 2016-01-14 NOTE — Assessment & Plan Note (Signed)
Patient is doing very well at this time. We discussed icing regimen and home exercises. Discussed which activities to do in which ones to potentially avoid. Patient come back and see me again in 2-3 months for further evaluation and treatment.

## 2016-03-14 ENCOUNTER — Ambulatory Visit (INDEPENDENT_AMBULATORY_CARE_PROVIDER_SITE_OTHER): Payer: 59 | Admitting: Family Medicine

## 2016-03-14 ENCOUNTER — Encounter: Payer: Self-pay | Admitting: Family Medicine

## 2016-03-14 VITALS — BP 110/70 | HR 64

## 2016-03-14 DIAGNOSIS — M9904 Segmental and somatic dysfunction of sacral region: Secondary | ICD-10-CM | POA: Diagnosis not present

## 2016-03-14 DIAGNOSIS — M533 Sacrococcygeal disorders, not elsewhere classified: Secondary | ICD-10-CM

## 2016-03-14 DIAGNOSIS — M9905 Segmental and somatic dysfunction of pelvic region: Secondary | ICD-10-CM

## 2016-03-14 DIAGNOSIS — M9903 Segmental and somatic dysfunction of lumbar region: Secondary | ICD-10-CM | POA: Diagnosis not present

## 2016-03-14 DIAGNOSIS — M999 Biomechanical lesion, unspecified: Secondary | ICD-10-CM

## 2016-03-14 NOTE — Assessment & Plan Note (Signed)
Decision today to treat with OMT was based on Physical Exam  After verbal consent patient was treated with HVLA, ME, FPR techniques in thoracic, lumbar and sacral as well as pelvic areas  Patient tolerated the procedure well with improvement in symptoms  Patient given exercises, stretches and lifestyle modifications  See medications in patient instructions if given  Patient will follow up in 4 weeks

## 2016-03-14 NOTE — Progress Notes (Signed)
[name] [place] [place], [place] [address] Phone: [phone] Subjective:     CC:  sacroiliac joint dysfunction followup  [id number] Melanie Villa is a 35 y.o. female coming in with complaint of  Low back pain.  Overall doing markedly well. Patient states that just some mild tightness recently. Patient has not been running on a regular basis secondary to temperature. Patient states when she is more active she seems to feel better. She does he exercises more she feels better as well. No new symptoms such as radiation down the leg or any numbness or tingling.  Left knee pain. 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  Nothing severe    Past Medical History  Diagnosis Date  . Allergy    No past surgical history on file. Social History  Substance Use Topics  . Smoking status: Former Research scientist (life sciences)  . Smokeless tobacco: Never Used  . Alcohol Use: 3.0 oz/week    5 Glasses of wine per week   Allergies  Allergen Reactions  . Amoxicillin    Family History  Problem Relation Age of Onset  . Hypertension Mother   . Alcohol abuse Neg Hx   . Asthma Neg Hx   . Cancer Neg Hx   . COPD Neg Hx   . Depression Neg Hx   . Diabetes Neg Hx   . Drug abuse Neg Hx   . Early death Neg Hx   . Hearing loss Neg Hx   . Heart disease Neg Hx   . Hyperlipidemia Neg Hx   . Kidney disease Neg Hx   . Stroke Neg Hx        Past medical history, social, surgical and family history all reviewed in electronic medical record.   Review of Systems: No headache, visual changes, nausea, vomiting, diarrhea, constipation, dizziness, abdominal pain, skin rash, fevers, chills, night sweats, weight loss, swollen lymph nodes, body aches, joint swelling, muscle aches, chest pain, shortness of breath, mood changes.   Objective Blood pressure 110/70, pulse 64.  General: No apparent distress alert and oriented x3 mood and affect normal, dressed appropriately.  HEENT:  Pupils equal, extraocular movements intact  Respiratory: Patient's speak in full sentences and does not appear short of breath  Cardiovascular: No lower extremity edema, non tender, no erythema  Skin: Warm dry intact with no signs of infection or rash on extremities or on axial skeleton.  Abdomen: Soft nontender  Neuro: Cranial nerves II through XII are intact, neurovascularly intact in all extremities with 2+ DTRs and 2+ pulses.  Lymph: No lymphadenopathy of posterior or anterior cervical chain or axillae bilaterally.  Gait normal with good balance and coordination.  MSK:  Non tender with full range of motion and good stability and symmetric strength and tone of shoulders, elbows, hip, and ankles bilaterally.   Knee:left  Normal to inspection with no erythema or effusion or obvious bony abnormalities. nontender  ROM full in flexion and extension and lower leg rotation. Ligaments with solid consistent endpoints including ACL, PCL, LCL, MCL. Negative Mcmurray's, Apley's, and Thessalonian tests. mild painful patellar compressionstill remaining. Patellar glide without crepitus. Patellar and quadriceps tendons unremarkable. Hamstring and quadriceps strength is normal.  Contralateral knee unremarkable Stable from previous exam  Back Exam:  Inspection: Unremarkable  Motion: Flexion 35 deg, Extension 35 deg, Side Bending to 35 deg bilaterally,  Rotation to 35 deg bilaterally mild increasing tightness from previous exam SLR laying: Negative  XSLR laying: Negative  Palpable tenderness:  nontender on exam today. FABER:   Negative today Sensory change: Gross sensation intact to all lumbar and sacral dermatomes.  Reflexes: 2+ at both patellar tendons, 2+ at achilles tendons, Babinski's downgoing.  Strength at foot  Plantar-flexion: 5/5 Dorsi-flexion: 5/5 Eversion: 5/5 Inversion: 5/5  Leg strength  Quad: 5/5 Hamstring: 5/5 Hip flexor: 5/5 Hip abductors: 4+/5  Gait unremarkable.  OMT  Physical Exam  Cervical  C2 flexed rotated and side bent left C6 flexed rotated and side bent right  Thoracic T1 and extended rotated and side bent left elevated first rib left T5 extended rotated and side bent right  Lumbar L2 flexed rotated and side bent right  Sacrum Right on right   Impression and Recommendations:     This case required medical decision making of moderate complexity.

## 2016-03-14 NOTE — Assessment & Plan Note (Signed)
Patient overall seems to be doing better. Discuss again the importance of hip abductor and doing the exercises on a more regular basis. We discussed any worsening symptoms she will come back sooner. Discussed icing regimen. Discussed stretching after running. Discuss keeping patient on more of a regular basis every 3 months for a manipulation. She'll come back sooner if any worsening symptoms.

## 2016-07-19 NOTE — Progress Notes (Signed)
[facility] [place],  [address] Phone: [phone] Subjective:     CC:  sacroiliac joint dysfunction followup  [id number]  Melanie Villa is a 35 y.o. female coming in with complaint of  Low back pain. Has been 4 months since last exam. 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 Patient states Has been doing very well but has been starting to train for a half marathon. She is. Started having more of mild increase in back pain and tightness. Patient is also having right heel pain. Seems worse in the morning. Better with activity. Worse after sitting for long amount of time. Denies any true injury except for the shoes. Changed activity all shoes and seems to be improving slowly.    Past Medical History:  Diagnosis Date  . Allergy    No past surgical history on file. Social History  Substance Use Topics  . Smoking status: Former Research scientist (life sciences)  . Smokeless tobacco: Never Used  . Alcohol use 3.0 oz/week    5 Glasses of wine per week   Allergies  Allergen Reactions  . Amoxicillin    Family History  Problem Relation Age of Onset  . Hypertension Mother   . Alcohol abuse Neg Hx   . Asthma Neg Hx   . Cancer Neg Hx   . COPD Neg Hx   . Depression Neg Hx   . Diabetes Neg Hx   . Drug abuse Neg Hx   . Early death Neg Hx   . Hearing loss Neg Hx   . Heart disease Neg Hx   . Hyperlipidemia Neg Hx   . Kidney disease Neg Hx   . Stroke Neg Hx        Past medical history, social, surgical and family history all reviewed in electronic medical record.   Review of Systems: No headache, visual changes, nausea, vomiting, diarrhea, constipation, dizziness, abdominal pain, skin rash, fevers, chills, night sweats, weight loss, swollen lymph nodes, body aches, joint swelling, muscle aches, chest pain, shortness of breath, mood changes.   Objective  Blood pressure 102/68, pulse 60, height 5\' 5"  (1.651 m), weight  126 lb (57.2 kg), SpO2 97 %.  /Systems examined below as of 07/20/16 General: NAD A&O x3 mood, affect normal  HEENT: Pupils equal, extraocular movements intact no nystagmus Respiratory: not short of breath at rest or with speaking Cardiovascular: No lower extremity edema, non tender Skin: Warm dry intact with no signs of infection or rash on extremities or on axial skeleton. Abdomen: Soft nontender, no masses Neuro: Cranial nerves  intact, neurovascularly intact in all extremities with 2+ DTRs and 2+ pulses. Lymph: No lymphadenopathy appreciated today  Gait normal with good balance and coordination.  MSK: Non tender with full range of motion and good stability and symmetric strength and tone of shoulders, elbows, wrist,  hips and ankles bilaterally.  Foot exam shows the patient does have some mild hypertrophy of the forefoot's of toes 2 through 5. No significant breakdown. Mild tenderness to palpation over the right medial calcaneal region. Otherwise unremarkable.   Back Exam:  Inspection: Unremarkable  Motion: Flexion 35 deg, Extension 25 deg, Side Bending to 35 deg bilaterally,  Rotation to 35 deg bilaterally mild increasing tightness from previous exam SLR laying: Negative  XSLR laying: Negative  Palpable tenderness:  nontender on exam today. FABER:   Negative today Sensory change: Gross sensation intact to all lumbar and sacral dermatomes.  Reflexes: 2+ at  both patellar tendons, 2+ at achilles tendons, Babinski's downgoing.  Strength at foot  Plantar-flexion: 5/5 Dorsi-flexion: 5/5 Eversion: 5/5 Inversion: 5/5  Leg strength  Quad: 5/5 Hamstring: 5/5 Hip flexor: 5/5 Hip abductors: 4+/5  Gait unremarkable.  OMT Physical Exam  Cervical  C2 flexed rotated and side bent left C7 flexed rotated and side bent right  Thoracic T1 and extended rotated and side bent left elevated first rib left T7 extended rotated and side bent right  Lumbar L2 flexed rotated and side bent  right  Sacrum Right on right   Impression and Recommendations:     This case required medical decision making of moderate complexity.

## 2016-07-20 ENCOUNTER — Encounter: Payer: Self-pay | Admitting: Family Medicine

## 2016-07-20 ENCOUNTER — Ambulatory Visit (INDEPENDENT_AMBULATORY_CARE_PROVIDER_SITE_OTHER): Payer: 59 | Admitting: Family Medicine

## 2016-07-20 VITALS — BP 102/68 | HR 60 | Ht 65.0 in | Wt 126.0 lb

## 2016-07-20 DIAGNOSIS — M999 Biomechanical lesion, unspecified: Secondary | ICD-10-CM | POA: Diagnosis not present

## 2016-07-20 DIAGNOSIS — M533 Sacrococcygeal disorders, not elsewhere classified: Secondary | ICD-10-CM | POA: Diagnosis not present

## 2016-07-20 NOTE — Patient Instructions (Signed)
Great to see you as always.  New exercises 3 times a week.  [employer] shoes could be great for your foot.  Ice after activity  Avoid being barefoot.  Try cross training on the bike a little bit up to christmas See me again before the end of the year Happy holidays!

## 2016-07-20 NOTE — Assessment & Plan Note (Signed)
Patient is doing very well. Has some mild rotation overall. We discussed potential crosstraining as well as the importance of hip abductors. We discussed icing regimen. Patient will continue to be active otherwise. Patient follow-up with me again in 4-6 weeks.

## 2016-07-20 NOTE — Assessment & Plan Note (Signed)
Decision today to treat with OMT was based on Physical Exam  After verbal consent patient was treated with HVLA, ME, FPR techniques in thoracic, lumbar and sacral as well as pelvic areas  Patient tolerated the procedure well with improvement in symptoms  Patient given exercises, stretches and lifestyle modifications  See medications in patient instructions if given  Patient will follow up in 4-8 weeks

## 2016-08-25 NOTE — Progress Notes (Deleted)
[facility] [place], [place] [address] Phone: [phone] Subjective:     CC:  sacroiliac joint dysfunction followup  [id number]  Melanie Villa is a 35 y.o. female coming in with complaint of  Low back pain. Has been 4 months since last exam. 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 Patient states Has been doing very well but has been starting to train for a half marathon. She is. Started having more of mild increase in back pain and tightness. Patient is also having right heel pain. Seems worse in the morning. Better with activity. Worse after sitting for long amount of time. Denies any true injury except for the shoes. Changed activity all shoes and seems to be improving slowly.    Past Medical History:  Diagnosis Date  . Allergy    No past surgical history on file. Social History  Substance Use Topics  . Smoking status: Former Research scientist (life sciences)  . Smokeless tobacco: Never Used  . Alcohol use 3.0 oz/week    5 Glasses of wine per week   Allergies  Allergen Reactions  . Amoxicillin    Family History  Problem Relation Age of Onset  . Hypertension Mother   . Alcohol abuse Neg Hx   . Asthma Neg Hx   . Cancer Neg Hx   . COPD Neg Hx   . Depression Neg Hx   . Diabetes Neg Hx   . Drug abuse Neg Hx   . Early death Neg Hx   . Hearing loss Neg Hx   . Heart disease Neg Hx   . Hyperlipidemia Neg Hx   . Kidney disease Neg Hx   . Stroke Neg Hx        Past medical history, social, surgical and family history all reviewed in electronic medical record.   Review of Systems: No headache, visual changes, nausea, vomiting, diarrhea, constipation, dizziness, abdominal pain, skin rash, fevers, chills, night sweats, weight loss, swollen lymph nodes, body aches, joint swelling, muscle aches, chest pain, shortness of breath, mood changes.   Objective  There were no vitals taken for this visit.  Everitt Amber examined  below as of 08/25/16 General: NAD A&O x3 mood, affect normal  HEENT: Pupils equal, extraocular movements intact no nystagmus Respiratory: not short of breath at rest or with speaking Cardiovascular: No lower extremity edema, non tender Skin: Warm dry intact with no signs of infection or rash on extremities or on axial skeleton. Abdomen: Soft nontender, no masses Neuro: Cranial nerves  intact, neurovascularly intact in all extremities with 2+ DTRs and 2+ pulses. Lymph: No lymphadenopathy appreciated today  Gait normal with good balance and coordination.  MSK: Non tender with full range of motion and good stability and symmetric strength and tone of shoulders, elbows, wrist,  hips and ankles bilaterally.  Foot exam shows the patient does have some mild hypertrophy of the forefoot's of toes 2 through 5. No significant breakdown. Mild tenderness to palpation over the right medial calcaneal region. Otherwise unremarkable.   Back Exam:  Inspection: Unremarkable  Motion: Flexion 35 deg, Extension 25 deg, Side Bending to 35 deg bilaterally,  Rotation to 35 deg bilaterally mild increasing tightness from previous exam SLR laying: Negative  XSLR laying: Negative  Palpable tenderness:  nontender on exam today. FABER:   Negative today Sensory change: Gross sensation intact to all lumbar and sacral dermatomes.  Reflexes: 2+ at both patellar tendons, 2+ at achilles tendons, Babinski's downgoing.  Strength at foot  Plantar-flexion: 5/5 Dorsi-flexion: 5/5 Eversion: 5/5 Inversion: 5/5  Leg strength  Quad: 5/5 Hamstring: 5/5 Hip flexor: 5/5 Hip abductors: 4+/5  Gait unremarkable.  OMT Physical Exam  Cervical  C2 flexed rotated and side bent left C7 flexed rotated and side bent right  Thoracic T1 and extended rotated and side bent left elevated first rib left T7 extended rotated and side bent right  Lumbar L2 flexed rotated and side bent right  Sacrum Right on right   Impression and  Recommendations:     This case required medical decision making of moderate complexity.

## 2016-08-26 ENCOUNTER — Ambulatory Visit: Payer: Self-pay | Admitting: Family Medicine

## 2016-09-17 NOTE — Progress Notes (Signed)
[facility] [place], [place] [address] Phone: [phone] Subjective:     CC:  sacroiliac joint dysfunction followup  [id number]  Melanie Villa is a 36 y.o. female coming in with complaint of  Low back pain. Has been 4 months since last exam. 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  Patient has not been seen for approximate 2 months. Patient is having worsening tightness of the lower back. The pain seems to be worsening little bit as well. Denies any numbness or tingling. Patient states though that does have significant stiffness after running that gives her trouble. Still training for the half marathon.    Past Medical History:  Diagnosis Date  . Allergy    No past surgical history on file. Social History  Substance Use Topics  . Smoking status: Former Research scientist (life sciences)  . Smokeless tobacco: Never Used  . Alcohol use 3.0 oz/week    5 Glasses of wine per week   Allergies  Allergen Reactions  . Amoxicillin    Family History  Problem Relation Age of Onset  . Hypertension Mother   . Alcohol abuse Neg Hx   . Asthma Neg Hx   . Cancer Neg Hx   . COPD Neg Hx   . Depression Neg Hx   . Diabetes Neg Hx   . Drug abuse Neg Hx   . Early death Neg Hx   . Hearing loss Neg Hx   . Heart disease Neg Hx   . Hyperlipidemia Neg Hx   . Kidney disease Neg Hx   . Stroke Neg Hx        Past medical history, social, surgical and family history all reviewed in electronic medical record.   Review of Systems: No headache, visual changes, nausea, vomiting, diarrhea, constipation, dizziness, abdominal pain, skin rash, fevers, chills, night sweats, weight loss, swollen lymph nodes, joint swelling, muscle aches, chest pain, shortness of breath, mood changes.    Objective  Blood pressure 108/72, pulse (!) 55, height 5\' 5"  (1.651 m), weight 126 lb (57.2 kg), SpO2 98 %.  Systems examined below as of 09/19/16 General: NAD  A&O x3 mood, affect normal  HEENT: Pupils equal, extraocular movements intact no nystagmus Respiratory: not short of breath at rest or with speaking Cardiovascular: No lower extremity edema, non tender Skin: Warm dry intact with no signs of infection or rash on extremities or on axial skeleton. Abdomen: Soft nontender, no masses Neuro: Cranial nerves  intact, neurovascularly intact in all extremities with 2+ DTRs and 2+ pulses. Lymph: No lymphadenopathy appreciated today  Gait normal with good balance and coordination.  MSK: Non tender with full range of motion and good stability and symmetric strength and tone of shoulders, elbows, wrist,  knee hips and ankles bilaterally.   Foot exam shows the patient does have some mild hypotrophy of the forefoot's of toes 2 through 5. No significant breakdown. Nontender on exam today.   Back Exam:  Inspection: Unremarkable  Motion: Flexion 35 deg, Extension 25 deg, Side Bending to 30 deg bilaterally,  Rotation to 30 deg bilaterally mild increasing tightness SLR laying: Negative  XSLR laying: Negative  Palpable tenderness:  nontender on exam today. FABER:  Mild increasing tightness bilaterally Sensory change: Gross sensation intact to all lumbar and sacral dermatomes.  Reflexes: 2+ at both patellar tendons, 2+ at achilles tendons, Babinski's downgoing.  Strength at foot  Plantar-flexion: 5/5 Dorsi-flexion: 5/5 Eversion: 5/5 Inversion: 5/5  Leg strength  Quad: 5/5 Hamstring: 5/5 Hip flexor: 5/5 Hip abductors: 4+/5  Gait unremarkable.  Osteopathic findings Cervical C2 flexed rotated and side bent right C4 flexed rotated and side bent left C6 flexed rotated and side bent left T3 extended rotated and side bent right inhaled third rib T9 extended rotated and side bent left L2 flexed rotated and side bent right Sacrum right on right    Impression and Recommendations:     This case required medical decision making of moderate  complexity.

## 2016-09-19 ENCOUNTER — Ambulatory Visit (INDEPENDENT_AMBULATORY_CARE_PROVIDER_SITE_OTHER): Payer: 59 | Admitting: Family Medicine

## 2016-09-19 ENCOUNTER — Encounter: Payer: Self-pay | Admitting: Family Medicine

## 2016-09-19 VITALS — BP 108/72 | HR 55 | Ht 65.0 in | Wt 126.0 lb

## 2016-09-19 DIAGNOSIS — M999 Biomechanical lesion, unspecified: Secondary | ICD-10-CM

## 2016-09-19 DIAGNOSIS — M533 Sacrococcygeal disorders, not elsewhere classified: Secondary | ICD-10-CM

## 2016-09-19 NOTE — Assessment & Plan Note (Signed)
Decision today to treat with OMT was based on Physical Exam  After verbal consent patient was treated with HVLA, ME, FPR techniques in thoracic, lumbar and sacral as well as pelvic areas  Patient tolerated the procedure well with improvement in symptoms  Patient given exercises, stretches and lifestyle modifications  See medications in patient instructions if given  Patient will follow up in 4-8 weeks

## 2016-09-19 NOTE — Assessment & Plan Note (Signed)
Discussed with patient again at great length. We discussed increasing activity again as well as the proper stretching. We discussed mechanics. We discussed ergonomics are up-to-date a could also be beneficial. Patient will try to make these small changes and come back and see me again in 4 weeks. Need to increase frequency of office visits well patient transfer the half marathon then decrease thereafter.

## 2016-09-19 NOTE — Patient Instructions (Addendum)
Good to see you as always.  You are doing great overall Hip flexor is key   Keep working on the feet and the hip abductors.  You will do well.  See me again in 4 weeks.

## 2016-10-03 LAB — HM PAP SMEAR

## 2016-10-24 NOTE — Progress Notes (Signed)
[name] [place], [place] [address] Phone: [phone] Subjective:     CC:  sacroiliac joint dysfunction followup  [id number]  Melanie Villa is a 36 y.o. female coming in with complaint of  Low back pain. Sacroiliac disease. Patient has been doing relatively well but is training for a marathon in the near future here. Patient is 3 weeks out from the race. Patient states that she has noticed some bilateral hip pain as well as some groin pain. Very mild overall. Seems worse with increasing duration of running. Denies any radiation down the legs, denies any numbness. Has responded to manipulation previously. States that also has worsening symptoms of her previous symptoms.   Past Medical History:  Diagnosis Date  . Allergy    No past surgical history on file. Social History  Substance Use Topics  . Smoking status: Former Research scientist (life sciences)  . Smokeless tobacco: Never Used  . Alcohol use 3.0 oz/week    5 Glasses of wine per week   Allergies  Allergen Reactions  . Amoxicillin    Family History  Problem Relation Age of Onset  . Hypertension Mother   . Alcohol abuse Neg Hx   . Asthma Neg Hx   . Cancer Neg Hx   . COPD Neg Hx   . Depression Neg Hx   . Diabetes Neg Hx   . Drug abuse Neg Hx   . Early death Neg Hx   . Hearing loss Neg Hx   . Heart disease Neg Hx   . Hyperlipidemia Neg Hx   . Kidney disease Neg Hx   . Stroke Neg Hx        Past medical history, social, surgical and family history all reviewed in electronic medical record.   Review of Systems: No headache, visual changes, nausea, vomiting, diarrhea, constipation, dizziness, abdominal pain, skin rash, fevers, chills, night sweats, weight loss, swollen lymph nodes, body aches, joint swelling,  chest pain, shortness of breath, mood changes. Positive for muscle aches     Objective  Blood pressure 112/70, pulse 65, height 5\' 5"  (1.651 m), weight 125 lb (56.7 kg), SpO2 98 %.    Systems examined below as of 10/25/16 General: NAD A&O x3 mood, affect normal  HEENT: Pupils equal, extraocular movements intact no nystagmus Respiratory: not short of breath at rest or with speaking Cardiovascular: No lower extremity edema, non tender Skin: Warm dry intact with no signs of infection or rash on extremities or on axial skeleton. Abdomen: Soft nontender, no masses Neuro: Cranial nerves  intact, neurovascularly intact in all extremities with 2+ DTRs and 2+ pulses. Lymph: No lymphadenopathy appreciated today  Gait normal with good balance and coordination.  MSK: Non tender with full range of motion and good stability and symmetric strength and tone of shoulders, elbows, wrist,  knee hips and ankles bilaterally.      Back Exam:  Inspection: Unremarkable  Motion: Flexion 35 deg, Extension 25 deg, Side Bending to 30 deg bilaterally,  Rotation to 30 deg bilaterally stable SLR laying: Negative  XSLR laying: Negative  Palpable tenderness:  Increased tenderness over the sacroiliac joints bilaterally as well as the pubic symphysis FABER:  Mild increasing tightness bilaterally Sensory change: Gross sensation intact to all lumbar and sacral dermatomes.  Reflexes: 2+ at both patellar tendons, 2+ at achilles tendons, Babinski's downgoing.  Strength at foot  Plantar-flexion: 5/5 Dorsi-flexion: 5/5 Eversion: 5/5 Inversion: 5/5  Leg strength  Quad: 5/5 Hamstring: 5/5 Hip flexor:  5/5 Hip abductors: 4+/5  Gait unremarkable.  Osteopathic findings Cervical C3 flexed rotated and side bent right C6 flexed rotated and side bent right T3 extended rotated and side bent right inhaled third rib T9 extended rotated and side bent left L2 flexed rotated and side bent right Sacrum right on right Right anterior ilium    Impression and Recommendations:     This case required medical decision making of moderate complexity.

## 2016-10-25 ENCOUNTER — Encounter: Payer: Self-pay | Admitting: Family Medicine

## 2016-10-25 ENCOUNTER — Ambulatory Visit (INDEPENDENT_AMBULATORY_CARE_PROVIDER_SITE_OTHER): Payer: 59 | Admitting: Family Medicine

## 2016-10-25 VITALS — BP 112/70 | HR 65 | Ht 65.0 in | Wt 125.0 lb

## 2016-10-25 DIAGNOSIS — M533 Sacrococcygeal disorders, not elsewhere classified: Secondary | ICD-10-CM | POA: Diagnosis not present

## 2016-10-25 DIAGNOSIS — M999 Biomechanical lesion, unspecified: Secondary | ICD-10-CM

## 2016-10-25 MED ORDER — VITAMIN D (ERGOCALCIFEROL) 1.25 MG (50000 UNIT) PO CAPS: 50000.0000 [IU] | ORAL_CAPSULE | ORAL | 0 refills | Status: DC

## 2016-10-25 NOTE — Patient Instructions (Signed)
Good to see you  You are doing well overall Once weekly vitamin D  Ice after training See me again in 2-4 weeks./

## 2016-10-25 NOTE — Assessment & Plan Note (Signed)
Continues to have some difficulty. Patient still has some muscle imbalances that is likely contributing to most of the discomfort. We discussed icing regimen and home exercises. Patient is to start increasing activity slowly. Follow-up again in 3-4 weeks with patient and increasing her running regimen we will increase frequency of office visits.

## 2016-10-25 NOTE — Assessment & Plan Note (Signed)
Decision today to treat with OMT was based on Physical Exam  After verbal consent patient was treated with HVLA, ME, FPR techniques in cervical, thoracic, lumbar and sacral areas  Patient tolerated the procedure well with improvement in symptoms  Patient given exercises, stretches and lifestyle modifications  See medications in patient instructions if given  Patient will follow up in 3-4 weeks  

## 2016-11-08 NOTE — Progress Notes (Signed)
[name] [place], [place] [address] Phone: [phone] Subjective:     CC:  sacroiliac joint dysfunction followup  [id number]  Melanie Villa is a 36 y.o. female coming in with complaint of  Low back pain. Sacroiliac disease. Patient has a marathon this weekend. Patient is having some pain but very mild at the moment. Patient states that she has started to decrease the running which seems to be helpful. Having some discomfort though in the pelvic area. Discussed icing regimen, home exercises, which patient has been doing intermittently. No new symptoms just some mild tightness of the lower back..   Past Medical History:  Diagnosis Date  . Allergy    History reviewed. No pertinent surgical history. Social History  Substance Use Topics  . Smoking status: Former Research scientist (life sciences)  . Smokeless tobacco: Never Used  . Alcohol use 3.0 oz/week    5 Glasses of wine per week   Allergies  Allergen Reactions  . Amoxicillin    Family History  Problem Relation Age of Onset  . Hypertension Mother   . Alcohol abuse Neg Hx   . Asthma Neg Hx   . Cancer Neg Hx   . COPD Neg Hx   . Depression Neg Hx   . Diabetes Neg Hx   . Drug abuse Neg Hx   . Early death Neg Hx   . Hearing loss Neg Hx   . Heart disease Neg Hx   . Hyperlipidemia Neg Hx   . Kidney disease Neg Hx   . Stroke Neg Hx        Past medical history, social, surgical and family history all reviewed in electronic medical record.   Review of Systems: No headache, visual changes, nausea, vomiting, diarrhea, constipation, dizziness, abdominal pain, skin rash, fevers, chills, night sweats, weight loss, swollen lymph nodes, body aches, joint swelling, muscle aches, chest pain, shortness of breath, mood changes.    Objective  Blood pressure 110/76, pulse 78, height 5\' 5"  (1.651 m), weight 125 lb (56.7 kg), SpO2 98 %.  Systems examined below as of 11/09/16 General: NAD A&O x3 mood, affect  normal  HEENT: Pupils equal, extraocular movements intact no nystagmus Respiratory: not short of breath at rest or with speaking Cardiovascular: No lower extremity edema, non tender Skin: Warm dry intact with no signs of infection or rash on extremities or on axial skeleton. Abdomen: Soft nontender, no masses Neuro: Cranial nerves  intact, neurovascularly intact in all extremities with 2+ DTRs and 2+ pulses. Lymph: No lymphadenopathy appreciated today  Gait normal with good balance and coordination.  MSK: Non tender with full range of motion and good stability and symmetric strength and tone of shoulders, elbows, wrist,  knee and ankles bilaterally.   Mild increased tightness of the hips bilaterally  Back Exam:  Inspection: Unremarkable  Motion: Flexion 35 deg, Extension 20 deg, Side Bending to 30 deg bilaterally,  Rotation to 30 deg bilaterally stable SLR laying: Negative  XSLR laying: Negative  Palpable tenderness:  Continued tenderness over the sacroiliac joints bilaterally as well as the pubic symphysis FABER:  Mild increasing tightness bilaterally possibly worse than previous exam Sensory change: Gross sensation intact to all lumbar and sacral dermatomes.  Reflexes: 2+ at both patellar tendons, 2+ at achilles tendons, Babinski's downgoing.  Strength at foot  Plantar-flexion: 5/5 Dorsi-flexion: 5/5 Eversion: 5/5 Inversion: 5/5  Leg strength  Quad: 5/5 Hamstring: 5/5 Hip flexor: 5/5 Hip abductors: 4+/5  Gait unremarkable.  Osteopathic  findings Cervical C2 flexed rotated and side bent right C6 flexed rotated and side bent left T3 extended rotated and side bent right inhaled third rib T8 extended rotated and side bent left L1 flexed rotated and side bent right Sacrum right on right Pain over the pubic symphysis as well.    Impression and Recommendations:     This case required medical decision making of moderate complexity.

## 2016-11-09 ENCOUNTER — Encounter: Payer: Self-pay | Admitting: Family Medicine

## 2016-11-09 ENCOUNTER — Ambulatory Visit (INDEPENDENT_AMBULATORY_CARE_PROVIDER_SITE_OTHER): Payer: 59 | Admitting: Family Medicine

## 2016-11-09 VITALS — BP 110/76 | HR 78 | Ht 65.0 in | Wt 125.0 lb

## 2016-11-09 DIAGNOSIS — M533 Sacrococcygeal disorders, not elsewhere classified: Secondary | ICD-10-CM | POA: Diagnosis not present

## 2016-11-09 DIAGNOSIS — M999 Biomechanical lesion, unspecified: Secondary | ICD-10-CM | POA: Diagnosis not present

## 2016-11-09 NOTE — Assessment & Plan Note (Signed)
Patient is been doing relatively well overall but is having increasing discomfort. Patient does have increasing pain over the pubic symphysis and potential stress reaction is within the differential. We discussed which activities to do an which was potentially avoid. Patient is going to see me again for marathon and we will discuss further treatment.

## 2016-11-09 NOTE — Assessment & Plan Note (Signed)
Decision today to treat with OMT was based on Physical Exam  After verbal consent patient was treated with HVLA, ME, FPR techniques in cervical, thoracic, lumbar and sacral areas  Patient tolerated the procedure well with improvement in symptoms  Patient given exercises, stretches and lifestyle modifications  See medications in patient instructions if given  Patient will follow up in 1 weeks 

## 2016-11-13 NOTE — Progress Notes (Signed)
[name] [place], [place] [address] Phone: [phone] Subjective:    I'm seeing this patient by the request  of:    CC:  Back pain follow-up  [id number]  Melanie Villa is a 36 y.o. female coming in with complaint of back pain. We have seen patient multiple ties for oseopatic manipulation.patient has started and finished a marathon recently. Patient states She is doing quite well with everything. States that it seems that more of her knee, bilateral feet, as well as hip seemed to be more uncomfortable than her back. Patient though still would like a manipulation. States that her some mild tightness but nothing severe.     Past Medical History:  Diagnosis Date  . Allergy    History reviewed. No pertinent surgical history. Social History   Social History  . Marital status: Single    Spouse name: N/A  . Number of children: N/A  . Years of education: N/A   Social History Main Topics  . Smoking status: Former Research scientist (life sciences)  . Smokeless tobacco: Never Used  . Alcohol use 3.0 oz/week    5 Glasses of wine per week  . Drug use: No  . Sexual activity: Yes    Birth control/ protection: None   Other Topics Concern  . None   Social History Narrative  . None   Allergies  Allergen Reactions  . Amoxicillin    Family History  Problem Relation Age of Onset  . Hypertension Mother   . Alcohol abuse Neg Hx   . Asthma Neg Hx   . Cancer Neg Hx   . COPD Neg Hx   . Depression Neg Hx   . Diabetes Neg Hx   . Drug abuse Neg Hx   . Early death Neg Hx   . Hearing loss Neg Hx   . Heart disease Neg Hx   . Hyperlipidemia Neg Hx   . Kidney disease Neg Hx   . Stroke Neg Hx     Past medical history, social, surgical and family history all reviewed in electronic medical record.  No pertanent information unless stated regarding to the chief complaint.   Review of Systems: No headache, visual changes, nausea, vomiting, diarrhea, constipation,  dizziness, abdominal pain, skin rash, fevers, chills, night sweats, weight loss, swollen lymph nodes, body aches, joint swelling, muscle aches, chest pain, shortness of breath, mood changes.    Objective  Blood pressure 110/72, pulse 69, height 5\' 5"  (1.651 m), weight 123 lb 12.8 oz (56.2 kg), SpO2 99 %. Systems examined below as of 11/14/16   General: No apparent distress alert and oriented x3 mood and affect normal, dressed appropriately.  HEENT: Pupils equal, extraocular movements intact  Respiratory: Patient's speak in full sentences and does not appear short of breath  Cardiovascular: No lower extremity edema, non tender, no erythema  Skin: Warm dry intact with no signs of infection or rash on extremities or on axial skeleton.  Abdomen: Soft nontender  Neuro: Cranial nerves II through XII are intact, neurovascularly intact in all extremities with 2+ DTRs and 2+ pulses.  Lymph: No lymphadenopathy of posterior or anterior cervical chain or axillae bilaterally.  Gait normal with good balance and coordination.  MSK:  Non tender with full range of motion and good stability and symmetric strength and tone of shoulders, elbows, wrist, hip, knee and ankles bilaterally.  Back Exam:  Inspection: Unremarkable  Motion: Flexion 45 deg, Extension 25 deg, Side Bending to 45 deg  bilaterally,  Rotation to 45 deg bilaterally  SLR laying: Negative  XSLR laying: Negative  Palpable tenderness: Very minimal tenderness in the lumbosacral area bilaterally no significant large changes. Patient will start now with recovery phase. We discussed different lower impact exercises that I think will be beneficial.. FABER: negative. Sensory change: Gross sensation intact to all lumbar and sacral dermatomes.  Reflexes: 2+ at both patellar tendons, 2+ at achilles tendons, Babinski's downgoing.  Strength at foot  Plantar-flexion: 5/5 Dorsi-flexion: 5/5 Eversion: 5/5 Inversion: 5/5  Leg strength  Quad: 5/5 Hamstring:  5/5 Hip flexor: 5/5 Hip abductors: 5/5  Gait unremarkable.  Osteopathic findings Cervical C4 flexed rotated and side bent left C6 flexed rotated and side bent left T3 extended rotated and side bent right inhaled third rib L1 flexed rotated and side bent right Sacrum right on right'     Impression and Recommendations:     This case required medical decision making of moderate complexity.      Note: This dictation was prepared with Dragon dictation along with smaller phrase technology. Any transcriptional errors that result from this process are unintentional.

## 2016-11-14 ENCOUNTER — Encounter: Payer: Self-pay | Admitting: Family Medicine

## 2016-11-14 ENCOUNTER — Ambulatory Visit (INDEPENDENT_AMBULATORY_CARE_PROVIDER_SITE_OTHER): Payer: 59 | Admitting: Family Medicine

## 2016-11-14 ENCOUNTER — Ambulatory Visit: Payer: Self-pay | Admitting: Family Medicine

## 2016-11-14 VITALS — BP 110/72 | HR 69 | Ht 65.0 in | Wt 123.8 lb

## 2016-11-14 DIAGNOSIS — M533 Sacrococcygeal disorders, not elsewhere classified: Secondary | ICD-10-CM

## 2016-11-14 DIAGNOSIS — M999 Biomechanical lesion, unspecified: Secondary | ICD-10-CM | POA: Diagnosis not present

## 2016-11-14 NOTE — Assessment & Plan Note (Signed)
Decision today to treat with OMT was based on Physical Exam  After verbal consent patient was treated with HVLA, ME, FPR techniques in cervical, thoracic, lumbar and sacral areas  Patient tolerated the procedure well with improvement in symptoms  Patient given exercises, stretches and lifestyle modifications  See medications in patient instructions if given  Patient will follow up in 3-4 weeks  

## 2016-11-14 NOTE — Patient Instructions (Signed)
Good to see you  I am impressed 4-6 weeks.

## 2016-11-14 NOTE — Assessment & Plan Note (Signed)
Patient and will start increasing activity as well as avoiding high impact exercises for the next 3 weeks. Follow-up again in 3-4 weeks. Continue same medications.

## 2016-12-30 ENCOUNTER — Ambulatory Visit: Payer: Self-pay | Admitting: Family Medicine

## 2017-01-02 NOTE — Progress Notes (Signed)
[name] [place], [place] [address] Phone: [phone] Subjective:    CC:  Back pain follow-up  [id number]  Melanie Villa is a 36 y.o. female coming in with complaint of back pain. We have seen patient multiple ties for osteopatic manipulation.  Patient after completing a marathon as not as active as she is been recently. Patient states some increasing stress and lower back pain. Patient has always responded well to the manipulation. Is having more of the left-sided back pain again. Seems to go into the buttocks area. No radiation down the leg.  Bilateral foot pain. Has had plantar fasciitis previously she states. An states that whenever she tries to increase running unfortunate she cannot do it secondary to the amount of pain.Patient states that this is been to chronic in wondering what else can be done.      Past Medical History:  Diagnosis Date  . Allergy    History reviewed. No pertinent surgical history. Social History   Social History  . Marital status: Single    Spouse name: N/A  . Number of children: N/A  . Years of education: N/A   Social History Main Topics  . Smoking status: Former Research scientist (life sciences)  . Smokeless tobacco: Never Used  . Alcohol use 3.0 oz/week    5 Glasses of wine per week  . Drug use: No  . Sexual activity: Yes    Birth control/ protection: None   Other Topics Concern  . None   Social History Narrative  . None   Allergies  Allergen Reactions  . Amoxicillin    Family History  Problem Relation Age of Onset  . Hypertension Mother   . Alcohol abuse Neg Hx   . Asthma Neg Hx   . Cancer Neg Hx   . COPD Neg Hx   . Depression Neg Hx   . Diabetes Neg Hx   . Drug abuse Neg Hx   . Early death Neg Hx   . Hearing loss Neg Hx   . Heart disease Neg Hx   . Hyperlipidemia Neg Hx   . Kidney disease Neg Hx   . Stroke Neg Hx     Past medical history, social, surgical and family history all reviewed in  electronic medical record.  No pertanent information unless stated regarding to the chief complaint.   Review of Systems: No headache, visual changes, nausea, vomiting, diarrhea, constipation, dizziness, abdominal pain, skin rash, fevers, chills, night sweats, weight loss, swollen lymph nodes, body aches, joint swelling, muscle aches, chest pain, shortness of breath, mood changes.  Complaining of more foot pain    Objective  There were no vitals taken for this visit.   Systems examined below as of 01/03/17 General: NAD A&O x3 mood, affect normal  HEENT: Pupils equal, extraocular movements intact no nystagmus Respiratory: not short of breath at rest or with speaking Cardiovascular: No lower extremity edema, non tender Skin: Warm dry intact with no signs of infection or rash on extremities or on axial skeleton. Abdomen: Soft nontender, no masses Neuro: Cranial nerves  intact, neurovascularly intact in all extremities with 2+ DTRs and 2+ pulses. Lymph: No lymphadenopathy appreciated today  Gait normal with good balance and coordination.  MSK: Non tender with full range of motion and good stability and symmetric strength and tone of shoulders, elbows, wrist,  knee hips and ankles bilaterally.   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 Symmetric. Discusses loading of the longitudinal arch. Back Exam:  Inspection: Unremarkable  Motion: Flexion 45 deg, Extension 25 deg, Side Bending to 45 deg bilaterally,  Rotation to 45 deg bilaterally  SLR laying: Negative  XSLR laying: Negative  Palpable tenderness: Very mild tenderness to palpation and appears palmar musculature mostly at the thoracolumbar and lumbosacral area mostly on the left side. FABER: negative. Sensory change: Gross sensation intact to all lumbar and sacral dermatomes.  Reflexes: 2+ at both patellar tendons, 2+ at achilles tendons, Babinski's downgoing.  Strength at  foot  Plantar-flexion: 5/5 Dorsi-flexion: 5/5 Eversion: 5/5 Inversion: 5/5  Leg strength  Quad: 5/5 Hamstring: 5/5 Hip flexor: 5/5 Hip abductors: 5/5  Gait unremarkable.  Osteopathic findings C2 flexed rotated and side bent right C4 flexed rotated and side bent left T3 extended rotated and side bent right inhaled third rib T11 extended rotated and side bent left L3 flexed rotated and side bent right Sacrum right on right     Impression and Recommendations:     This case required medical decision making of moderate complexity.      Note: This dictation was prepared with Dragon dictation along with smaller phrase technology. Any transcriptional errors that result from this process are unintentional.

## 2017-01-03 ENCOUNTER — Ambulatory Visit (INDEPENDENT_AMBULATORY_CARE_PROVIDER_SITE_OTHER): Payer: 59 | Admitting: Family Medicine

## 2017-01-03 ENCOUNTER — Encounter: Payer: Self-pay | Admitting: Family Medicine

## 2017-01-03 DIAGNOSIS — M999 Biomechanical lesion, unspecified: Secondary | ICD-10-CM | POA: Diagnosis not present

## 2017-01-03 DIAGNOSIS — M533 Sacrococcygeal disorders, not elsewhere classified: Secondary | ICD-10-CM | POA: Diagnosis not present

## 2017-01-03 DIAGNOSIS — R269 Unspecified abnormalities of gait and mobility: Secondary | ICD-10-CM | POA: Insufficient documentation

## 2017-01-03 NOTE — Assessment & Plan Note (Signed)
Patient and will be working on core and hip abductor strengthening. We discussed icing regimen. We discussed which activities doing which ones to avoid. Patient follow-up with me again in 4-6 weeks. No significant change in management.

## 2017-01-03 NOTE — Patient Instructions (Addendum)
Good to see you as always.  ICe is your friend.  We will get the orthotics and call you when they are in  L3030 is the code to ask insurance what they will pay if you want Planks and core may be better to focus See me again in 4-6 weeks.

## 2017-01-03 NOTE — Assessment & Plan Note (Signed)
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 Discussed with patient at great length. Will be set up for custom orthotics in the long run. We discussed icing regimen. Patient will see how she does with this and follow-up with me otherwise 4 weeks after the orthotics.

## 2017-01-03 NOTE — Assessment & Plan Note (Signed)
Decision today to treat with OMT was based on Physical Exam  After verbal consent patient was treated with HVLA, ME, FPR techniques in cervical, thoracic,rib pelvis lumbar and sacral areas  Patient tolerated the procedure well with improvement in symptoms  Patient given exercises, stretches and lifestyle modifications  See medications in patient instructions if given  Patient will follow up in 4-6 weeks

## 2017-01-06 ENCOUNTER — Ambulatory Visit (INDEPENDENT_AMBULATORY_CARE_PROVIDER_SITE_OTHER): Payer: 59 | Admitting: Family Medicine

## 2017-01-06 DIAGNOSIS — R269 Unspecified abnormalities of gait and mobility: Secondary | ICD-10-CM

## 2017-01-06 NOTE — Assessment & Plan Note (Signed)
Patient will be put in orthotics. Patient did well with the procedure today. Patient will start to wear them and increase wear over the course of time. We discussed that we can make changes if necessary. Follow-up again in 4 weeks

## 2017-01-06 NOTE — Progress Notes (Signed)
  Procedure Note   Patient was fitted for a : All-around, standard, cushioned, semi-rigid orthotic. The orthotic was heated and afterward the patient patient seated position and molded The patient was positioned in subtalar neutral position and 10 degrees of ankle dorsiflexion in a weight bearing stance. After completion of molding, patient did have orthotic management The blank was ground to a stable position for weight bearing. Size: women's 7.5  Base: Carbon fiber Additional Posting and Padding: Left foot: 250/100x2 medial longitudinal arch Right foot: 250/70x2 medial longitudinal arch Bilateral velcro posting near base of the fifth The patient ambulated these, and they were very comfortable.

## 2017-01-17 ENCOUNTER — Other Ambulatory Visit: Payer: Self-pay | Admitting: Family Medicine

## 2017-04-11 ENCOUNTER — Encounter: Payer: Self-pay | Admitting: Family Medicine

## 2017-04-11 ENCOUNTER — Ambulatory Visit (INDEPENDENT_AMBULATORY_CARE_PROVIDER_SITE_OTHER): Payer: 59 | Admitting: Family Medicine

## 2017-04-11 VITALS — BP 110/78 | HR 80 | Ht 65.0 in

## 2017-04-11 DIAGNOSIS — M79671 Pain in right foot: Secondary | ICD-10-CM | POA: Diagnosis not present

## 2017-04-11 DIAGNOSIS — M533 Sacrococcygeal disorders, not elsewhere classified: Secondary | ICD-10-CM

## 2017-04-11 DIAGNOSIS — M79672 Pain in left foot: Secondary | ICD-10-CM

## 2017-04-11 DIAGNOSIS — M999 Biomechanical lesion, unspecified: Secondary | ICD-10-CM | POA: Diagnosis not present

## 2017-04-11 NOTE — Assessment & Plan Note (Signed)
Stable overall. We'll continue with this and this time. We discussed icing regimen and home exercises. Discussed core strengthening again. Patient saw his work a little more on the hip abductor strengthening. Discussed icing regimen. Patient will continue to be active otherwise. Follow-up again with me in 6-12 weeks

## 2017-04-11 NOTE — Assessment & Plan Note (Signed)
Patient continues to have bilateral foot pain. 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 We discussed that I do feel that a gait analysis will be beneficial in patient will start with formal physical therapy. We discussed icing regimen, encourage her to continue with the custom orthotics. Patient will continue to stay active. Follow-up with me again in 6-12 weeks.

## 2017-04-11 NOTE — Progress Notes (Signed)
[name] [place], [place] [address] Phone: [phone] Subjective:     CC: Pain, back pain, foot pain  [id number]  Melanie Villa is a 36 y.o. female coming in with complaint of neck and back pain. Has been 3 months since we seen patient. Has responded well to osteopathic manipulation. Was having difficulty with the pelvic area previously. Patient states continues to have some tightness. Still unable to start running still having more foot pain bilaterally. Patient states that if she walks barefoot she has increasing discomfort again. Has not changed anything else at this time. Continues on the vitamin D. States that her back has been feeling somewhat better overall.      Past Medical History:  Diagnosis Date  . Allergy    No past surgical history on file. Social History   Social History  . Marital status: Single    Spouse name: N/A  . Number of children: N/A  . Years of education: N/A   Social History Main Topics  . Smoking status: Former Research scientist (life sciences)  . Smokeless tobacco: Never Used  . Alcohol use 3.0 oz/week    5 Glasses of wine per week  . Drug use: No  . Sexual activity: Yes    Birth control/ protection: None   Other Topics Concern  . None   Social History Narrative  . None   Allergies  Allergen Reactions  . Amoxicillin    Family History  Problem Relation Age of Onset  . Hypertension Mother   . Alcohol abuse Neg Hx   . Asthma Neg Hx   . Cancer Neg Hx   . COPD Neg Hx   . Depression Neg Hx   . Diabetes Neg Hx   . Drug abuse Neg Hx   . Early death Neg Hx   . Hearing loss Neg Hx   . Heart disease Neg Hx   . Hyperlipidemia Neg Hx   . Kidney disease Neg Hx   . Stroke Neg Hx      Past medical history, social, surgical and family history all reviewed in electronic medical record.  No pertanent information unless stated regarding to the chief complaint.   Review of Systems:Review of systems updated and as  accurate as of 04/11/17  No headache, visual changes, nausea, vomiting, diarrhea, constipation, dizziness, abdominal pain, skin rash, fevers, chills, night sweats, weight loss, swollen lymph nodes, body aches, joint swelling, muscle aches, chest pain, shortness of breath, mood changes.   Objective  Blood pressure 110/78, pulse 80, height 5\' 5"  (3.570 m), SpO2 97 %. Systems examined below as of 04/11/17   General: No apparent distress alert and oriented x3 mood and affect normal, dressed appropriately.  HEENT: Pupils equal, extraocular movements intact  Respiratory: Patient's speak in full sentences and does not appear short of breath  Cardiovascular: No lower extremity edema, non tender, no erythema  Skin: Warm dry intact with no signs of infection or rash on extremities or on axial skeleton.  Abdomen: Soft nontender  Neuro: Cranial nerves II through XII are intact, neurovascularly intact in all extremities with 2+ DTRs and 2+ pulses.  Lymph: No lymphadenopathy of posterior or anterior cervical chain or axillae bilaterally.  Gait normal with good balance and coordination.  MSK:  Non tender with full range of motion and good stability and symmetric strength and tone of shoulders, elbows, wrist, hip, knee and ankles bilaterally.  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 Mild splaying between the first and second toes up or down the transverse arch. Mild overpronation of the hindfoot. Back Exam:  Inspection: Unremarkable  Motion: Flexion 45 deg, Extension 25 deg, Side Bending to 35 deg bilaterally,  Rotation to 35 deg bilaterally  SLR laying: Negative  XSLR laying: Negative  Palpable tenderness: Tender to palpation in the paraspinal musculature lumbar spine right greater than left. Mild pain over the. Sentences. FABER: negative. Sensory change: Gross sensation intact to all lumbar and sacral dermatomes.  Reflexes: 2+ at both patellar tendons, 2+ at  achilles tendons, Babinski's downgoing.  Strength at foot  Plantar-flexion: 5/5 Dorsi-flexion: 5/5 Eversion: 5/5 Inversion: 5/5  Leg strength  Quad: 5/5 Hamstring: 5/5 Hip flexor: 5/5 Hip abductors: 5/5  Gait unremarkable.  Osteopathic findings C2 flexed rotated and side bent right C4 flexed rotated and side bent left C7 flexed rotated and side bent left T3 extended rotated and side bent right inhaled third rib T11 extended rotated and side bent left L3 flexed rotated and side bent right Sacrum right on right    Impression and Recommendations:     This case required medical decision making of moderate complexity.      Note: This dictation was prepared with Dragon dictation along with smaller phrase technology. Any transcriptional errors that result from this process are unintentional.

## 2017-04-11 NOTE — Patient Instructions (Addendum)
Great to see you as always.  Stay active.  I am impressed  We will get you in with PT to see if we can have them go over the exercises Try 2 weeks off the birth control and see what you think.  Newtons Distance or motion would be good to try  Overall the back is doing great  Send me a message in 1 month so I know how you are doing.  See me again in 2-3 months Travel safe!!!!

## 2017-04-11 NOTE — Assessment & Plan Note (Signed)
Decision today to treat with OMT was based on Physical Exam  After verbal consent patient was treated with HVLA, ME, FPR techniques in cervical, thoracic, lumbar and sacral and pelvic areas  Patient tolerated the procedure well with improvement in symptoms  Patient given exercises, stretches and lifestyle modifications  See medications in patient instructions if given  Patient will follow up in 6-12 weeks

## 2017-04-12 ENCOUNTER — Ambulatory Visit: Payer: Self-pay | Admitting: Family Medicine

## 2017-04-19 ENCOUNTER — Encounter: Payer: Self-pay | Admitting: Physical Therapy

## 2017-04-19 ENCOUNTER — Ambulatory Visit: Payer: 59 | Attending: Internal Medicine | Admitting: Physical Therapy

## 2017-04-19 DIAGNOSIS — R2689 Other abnormalities of gait and mobility: Secondary | ICD-10-CM | POA: Diagnosis present

## 2017-04-19 DIAGNOSIS — M6281 Muscle weakness (generalized): Secondary | ICD-10-CM | POA: Diagnosis present

## 2017-04-19 DIAGNOSIS — M25572 Pain in left ankle and joints of left foot: Secondary | ICD-10-CM | POA: Diagnosis present

## 2017-04-19 DIAGNOSIS — M25571 Pain in right ankle and joints of right foot: Secondary | ICD-10-CM | POA: Insufficient documentation

## 2017-04-19 NOTE — Therapy (Signed)
[place], [name], [address] Phone: [phone]   Fax:  [phone]  Physical Therapy Evaluation  Patient Details  [name] MRN: [id number] Date of Birth: 11-15-1980 Referring Provider: Hulan Saas DO  Encounter Date: 04/19/2017      PT End of Session - 04/19/17 0806    Visit Number 1   Number of Visits 13   Date for PT Re-Evaluation 06/14/17   PT Start Time 0806  pt was 6 min late   PT Stop Time 0850   PT Time Calculation (min) 44 min   Activity Tolerance Patient tolerated treatment well   Behavior During Therapy [facility] for tasks assessed/performed      Past Medical History:  Diagnosis Date  . Allergy     History reviewed. No pertinent surgical history.  There were no vitals filed for this visit.       Subjective Assessment - 04/19/17 0809    Subjective pt is a 36 y.o with CC of bil foot that started back in October 2017 which she stated started from marathon running. Started in the R foot first then gradually worked to the L foot and now is equally in both. denies N/T. pain stays in the arch andinto the heel but nothing to into the calf.    Limitations Walking;Other (comment)  Running   How long can you sit comfortably? unlimited   How long can you stand comfortably? unlimited   How long can you walk comfortably? unlimited   Diagnostic tests N/A   Patient Stated Goals decrease pain, resume running, and wear regular shoes (non-orthotics)    Currently in Pain? Yes   Pain Score 3   at worst 5/10    Pain Location Foot   Pain Orientation Left;Right   Pain Descriptors / Indicators Dull;Burning;Throbbing   Pain Type Chronic pain   Pain Onset More than a month ago   Pain Frequency Constant   Aggravating Factors  standing/ walking, getting of bend in the morning,    Pain Relieving Factors wearing             [facility] PT Assessment - 04/19/17 0817      Assessment   Medical  Diagnosis bil foot pain   Referring Provider Hulan Saas DO   Onset Date/Surgical Date --  October 2017   Hand Dominance Right   Next MD Visit --  PRN   Prior Therapy NO     Precautions   Precautions None     Restrictions   Weight Bearing Restrictions No     Balance Screen   Has the patient fallen in the past 6 months No   Has the patient had a decrease in activity level because of a fear of falling?  No   Is the patient reluctant to leave their home because of a fear of falling?  No     Home Environment   Living Environment Private residence   Living Arrangements Spouse/significant other   Available Help at Discharge Family;Available PRN/intermittently   Type of Home House   Home Access Stairs to enter   Entrance Stairs-Number of Steps 3   Entrance Stairs-Rails None   Home Layout Two level   Alternate Level Stairs-Number of Steps 20   Alternate Level Stairs-Rails Right   Additional Comments night splint     Prior Function   Level of Independence Independent   Vocation Full time Photographer Requirements prolonged standing,  prolonged sitting, some lifting and carrying   Leisure running,      Cognition   Overall Cognitive Status Within Functional Limits for tasks assessed     Posture/Postural Control   Posture/Postural Control Postural limitations   Postural Limitations Rounded Shoulders;Forward head     ROM / Strength   AROM / PROM / Strength AROM;Strength     AROM   AROM Assessment Site Ankle   Right/Left Ankle Right;Left   Right Ankle Dorsiflexion 1  from neutral   Right Ankle Plantar Flexion 70   Right Ankle Inversion 22   Right Ankle Eversion 14   Left Ankle Dorsiflexion 0  from neutral   Left Ankle Plantar Flexion 64   Left Ankle Inversion 20   Left Ankle Eversion 13     Strength   Overall Strength Comments 3+/5 in bil posterior tibs   Strength Assessment Site Ankle   Right/Left Ankle Right;Left   Right Ankle  Dorsiflexion 5/5   Right Ankle Plantar Flexion 4+/5  pain during testing   Right Ankle Inversion 4+/5   Right Ankle Eversion 4+/5   Left Ankle Dorsiflexion 4+/5   Left Ankle Plantar Flexion 4/5  pain during testing   Left Ankle Inversion 4+/5   Left Ankle Eversion 4+/5     Palpation   Palpation comment TTP in bil Plantar Fascia, at the medial cancaneal tubecle and in bil gastroc soleus complex            Objective measurements completed on examination: See above findings.          [facility] Adult PT Treatment/Exercise - 04/19/17 0817      Manual Therapy   Manual Therapy Soft tissue mobilization   Soft tissue mobilization IASTM over bil arches/ medial calcaneal tubecle          Trigger Point Dry Needling - 04/19/17 0905    Consent Given? Yes   Education Handout Provided Yes   Muscles Treated Lower Body Quadratus plantae   Quadatus Plantae Response Twitch response elicited;Palpable increased muscle length  bil              PT Education - 04/19/17 0851    Education provided Yes   Education Details evaluation findings, POC, goals, HEP with form/ rationale, muscle anatomy, and anatomy involved and referral patterns. what TPDN is, benefits, what to expect and after care.    Person(s) Educated Patient   Methods Explanation;Verbal cues;Handout;Demonstration   Comprehension Verbalized understanding;Verbal cues required;Returned demonstration          PT Short Term Goals - 04/19/17 0859      PT SHORT TERM GOAL #1   Title pt to be I with inital HEP    Time 3   Period Weeks   Status New   Target Date 05/10/17     PT SHORT TERM GOAL #2   Title pt to increase bil Df to >/= 3 degrees with </= 3/10 pain to promote mobility    Time 3   Period Weeks   Status New   Target Date 05/10/17           PT Long Term Goals - 04/19/17 0900      PT LONG TERM GOAL #1   Title increase posterior tib strength to >/= 4/5 to promote arch control/ stability with  walking/ running activities    Time 6   Period Weeks   Status New   Target Date 05/31/17     PT LONG TERM GOAL #2  Title pt to report no pain with walking/ standing and during the night and when getting out of bed to demonstrate improving condition   Time 6   Period Weeks   Status New   Target Date 05/31/17     PT LONG TERM GOAL #3   Title pt to increase DF to >/= 8 degrees bil to demonstrate decreased calf tightness and promote functional mobility for running and walking activities    Time 6   Period Weeks   Status New   Target Date 05/31/17     PT LONG TERM GOAL #4   Title pt to be able to run for >/= 10 min with </= 2/10 pain for personal goal of returning to running   Time 6   Period Weeks   Status New   Target Date 05/31/17     PT LONG TERM GOAL #5   Title pt to be I with all HEP given as of last visit    Time 6   Period Weeks   Status New   Target Date 05/31/17                Plan - 04/19/17 2376    Clinical Impression Statement pt presents to OPPT with CC of bil foot pain started in October 2017 from marathon training. she demonstrates functional mobility in all planes except for DF going to nuetral bil. TTP in bil plantar fascia, medical calcaneal tubecles, tibialis posterior, and tightness in bil gastroc/ soleus. she demonstrates hyperprontation in gait with min medial heel whip. TPDN was explained and performed on bil quadratus plantae followed with IASTM techniques. she reported decreased pain with standing/ walking post session. She would benefit from physical therapy to decrease bil heel pain, improve mobilty/ ankle stability, and return pt to PLOF by addressing the deficits listed below.    Clinical Presentation Stable   Clinical Decision Making Low   Rehab Potential Good   PT Frequency 2x / week   PT Duration 6 weeks   PT Treatment/Interventions ADLs/Self Care Home Management;Cryotherapy;Electrical Stimulation;Iontophoresis 56m/ml  Dexamethasone;Ultrasound;Therapeutic activities;Therapeutic exercise;Dry needling;Taping;Manual techniques;Patient/family education;Gait training;Moist Heat;Balance training   PT Next Visit Plan review HEP and update PRN, how was DN, IASTM techniques, post tib strengthening, balance tranining, heel raise with met head on towel   PT Home Exercise Plan previous HEP from [facility], toe yoga, posterior tib strengthening, and 4-way theraband strengthening.    Consulted and Agree with Plan of Care Patient      Patient will benefit from skilled therapeutic intervention in order to improve the following deficits and impairments:  Abnormal gait, Pain, Improper body mechanics, Postural dysfunction, Decreased strength, Decreased range of motion, Increased fascial restricitons, Increased muscle spasms, Decreased endurance, Decreased activity tolerance  Visit Diagnosis: Pain in left ankle and joints of left foot - Plan: PT plan of care cert/re-cert  Pain in right ankle and joints of right foot - Plan: PT plan of care cert/re-cert  Other abnormalities of gait and mobility - Plan: PT plan of care cert/re-cert  Muscle weakness (generalized) - Plan: PT plan of care cert/re-cert     Problem List Patient Active Problem List   Diagnosis Date Noted  . Bilateral foot pain 04/11/2017  . Abnormality of gait 01/03/2017  . 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 SI (sacroiliac) joint dysfunction 09/18/2015  . Nonallopathic lesion of sacral region 09/18/2015  . Nonallopathic lesion of pelvic region 09/18/2015  . Nonallopathic lesion of lumbosacral region 09/18/2015  . Epigastric  abdominal tenderness without rebound tenderness 06/30/2015  . IBS (irritable bowel syndrome) 06/30/2015  . Allergic rhinitis 01/04/2013    [name], [name], LAT, [name]  04/19/17  9:08 AM      [facility] [address] [place], [name], [address] Phone: [phone]   Fax:   [phone]  [name] MRN: [id number] Date of Birth: 1980-09-22

## 2017-05-05 ENCOUNTER — Ambulatory Visit: Payer: 59 | Attending: Internal Medicine | Admitting: Physical Therapy

## 2017-05-05 ENCOUNTER — Encounter: Payer: Self-pay | Admitting: Physical Therapy

## 2017-05-05 DIAGNOSIS — M25571 Pain in right ankle and joints of right foot: Secondary | ICD-10-CM | POA: Diagnosis present

## 2017-05-05 DIAGNOSIS — R2689 Other abnormalities of gait and mobility: Secondary | ICD-10-CM | POA: Insufficient documentation

## 2017-05-05 DIAGNOSIS — M6281 Muscle weakness (generalized): Secondary | ICD-10-CM | POA: Insufficient documentation

## 2017-05-05 DIAGNOSIS — M25572 Pain in left ankle and joints of left foot: Secondary | ICD-10-CM | POA: Insufficient documentation

## 2017-05-05 NOTE — Therapy (Signed)
[place], [name], [address] Phone: [phone]   Fax:  [phone]  Physical Therapy Treatment  Patient Details  [name] MRN: [id number] Date of Birth: 1981-01-23 Referring Provider: Hulan Saas DO  Encounter Date: 05/05/2017      PT End of Session - 05/05/17 0851    Visit Number 2   Number of Visits 13   Date for PT Re-Evaluation 06/14/17   PT Start Time 0808  pt arrived 8 min late today   PT Stop Time 0851   PT Time Calculation (min) 43 min   Activity Tolerance Patient tolerated treatment well   Behavior During Therapy [facility] for tasks assessed/performed      Past Medical History:  Diagnosis Date  . Allergy     History reviewed. No pertinent surgical history.  There were no vitals filed for this visit.      Subjective Assessment - 05/05/17 0810    Subjective "The DN last session helped, I still get some aching and pulsing but not as much.    Currently in Pain? Yes   Pain Score 0-No pain   Pain Location Foot   Pain Orientation Right;Left   Pain Descriptors / Indicators Aching;Sore   Pain Onset More than a month ago   Pain Frequency Intermittent   Aggravating Factors  prolonged standing/ walking, getting    Pain Relieving Factors wearing                         [facility] Adult PT Treatment/Exercise - 05/05/17 0844      Knee/Hip Exercises: Sidelying   Hip ABduction 2 sets  going to fatigue     Manual Therapy   Soft tissue mobilization IASTM over bil arches/ medial calcaneal tubecle, calves, twisting over bil medial cancaneal tubercles     Ankle Exercises: Stretches   Plantar Fascia Stretch 30 seconds;3 reps   Gastroc Stretch 3 reps;30 seconds          Trigger Point Dry Needling - 05/05/17 0814    Consent Given? Yes   Education Handout Provided No  given previously   Muscles Treated Lower Body Gastrocnemius;Soleus;Quadratus plantae   Gastrocnemius  Response Twitch response elicited;Palpable increased muscle length   Soleus Response Twitch response elicited;Palpable increased muscle length   Quadatus Plantae Response Twitch response elicited;Palpable increased muscle length              PT Education - 05/05/17 0850    Education provided Yes   Education Details benefits of exercing muscles to fatigue to work on endurance as it relates to running. benefits of strengthening hip to promtoe proximal control for arch   Person(s) Educated Patient   Methods Explanation;Verbal cues   Comprehension Verbalized understanding;Verbal cues required          PT Short Term Goals - 04/19/17 0859      PT SHORT TERM GOAL #1   Title pt to be I with inital HEP    Time 3   Period Weeks   Status New   Target Date 05/10/17     PT SHORT TERM GOAL #2   Title pt to increase bil Df to >/= 3 degrees with </= 3/10 pain to promote mobility    Time 3   Period Weeks   Status New   Target Date 05/10/17           PT Long Term Goals - 04/19/17 0900  PT LONG TERM GOAL #1   Title increase posterior tib strength to >/= 4/5 to promote arch control/ stability with walking/ running activities    Time 6   Period Weeks   Status New   Target Date 05/31/17     PT LONG TERM GOAL #2   Title pt to report no pain with walking/ standing and during the night and when getting out of bed to demonstrate improving condition   Time 6   Period Weeks   Status New   Target Date 05/31/17     PT LONG TERM GOAL #3   Title pt to increase DF to >/= 8 degrees bil to demonstrate decreased calf tightness and promote functional mobility for running and walking activities    Time 6   Period Weeks   Status New   Target Date 05/31/17     PT LONG TERM GOAL #4   Title pt to be able to run for >/= 10 min with </= 2/10 pain for personal goal of returning to running   Time 6   Period Weeks   Status New   Target Date 05/31/17     PT LONG TERM GOAL #5   Title pt  to be I with all HEP given as of last visit    Time 6   Period Weeks   Status New   Target Date 05/31/17               Plan - 05/05/17 0851    Clinical Impression Statement pt arrived 8 min late today. reported no pain today but has had some tightness / soreness since the last session. continued DN over the quadratus Plantae adding the gastroc bil. Soft tssue work and hip strengthening to fatigue to promote endurance and proximal support/ control. she reported minimal soreness from DN post session and declined modalities.    PT Treatment/Interventions ADLs/Self Care Home Management;Cryotherapy;Electrical Stimulation;Iontophoresis 85m/ml Dexamethasone;Ultrasound;Therapeutic activities;Therapeutic exercise;Dry needling;Taping;Manual techniques;Patient/family education;Gait training;Moist Heat;Balance training   PT Next Visit Plan update PRN, how was DN, IASTM techniques, post tib strengthening, balance tranining, heel raise with met head on towel   PT Home Exercise Plan previous HEP from MD, toe yoga, posterior tib strengthening, and 4-way theraband strengthening.    Consulted and Agree with Plan of Care Patient      Patient will benefit from skilled therapeutic intervention in order to improve the following deficits and impairments:  Abnormal gait, Pain, Improper body mechanics, Postural dysfunction, Decreased strength, Decreased range of motion, Increased fascial restricitons, Increased muscle spasms, Decreased endurance, Decreased activity tolerance  Visit Diagnosis: Pain in left ankle and joints of left foot  Pain in right ankle and joints of right foot  Other abnormalities of gait and mobility  Muscle weakness (generalized)     Problem List Patient Active Problem List   Diagnosis Date Noted  . Bilateral foot pain 04/11/2017  . Abnormality of gait 01/03/2017  . 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 SI (sacroiliac) joint dysfunction 09/18/2015  . Nonallopathic lesion  of sacral region 09/18/2015  . Nonallopathic lesion of pelvic region 09/18/2015  . Nonallopathic lesion of lumbosacral region 09/18/2015  . Epigastric abdominal tenderness without rebound tenderness 06/30/2015  . IBS (irritable bowel syndrome) 06/30/2015  . Allergic rhinitis 01/04/2013   KStarr LakePT, [name], LAT, [name]  05/05/17  8:53 AM      [facility] [address]Phone: [phone]  Fax:  [phone]  [name] MRN: [id number] Date of Birth: 19-Aug-1981

## 2017-05-09 ENCOUNTER — Encounter: Payer: Self-pay | Admitting: Physical Therapy

## 2017-05-09 ENCOUNTER — Ambulatory Visit: Payer: 59 | Admitting: Physical Therapy

## 2017-05-09 DIAGNOSIS — M25571 Pain in right ankle and joints of right foot: Secondary | ICD-10-CM

## 2017-05-09 DIAGNOSIS — M25572 Pain in left ankle and joints of left foot: Secondary | ICD-10-CM | POA: Diagnosis not present

## 2017-05-09 DIAGNOSIS — R2689 Other abnormalities of gait and mobility: Secondary | ICD-10-CM

## 2017-05-09 DIAGNOSIS — M6281 Muscle weakness (generalized): Secondary | ICD-10-CM

## 2017-05-09 NOTE — Therapy (Signed)
[place], [name], [address] Phone: [phone]   Fax:  [phone]  Physical Therapy Treatment  Patient Details  [name] MRN: [id number] Date of Birth: Sep 24, 1980 Referring Provider: Hulan Saas DO  Encounter Date: 05/09/2017      PT End of Session - 05/09/17 0904    Visit Number 3   Number of Visits 13   Date for PT Re-Evaluation 06/14/17   PT Start Time 0806   PT Stop Time 0846   PT Time Calculation (min) 40 min   Activity Tolerance Patient tolerated treatment well   Behavior During Therapy [facility] for tasks assessed/performed      Past Medical History:  Diagnosis Date  . Allergy     History reviewed. No pertinent surgical history.  There were no vitals filed for this visit.      Subjective Assessment - 05/09/17 0808    Subjective "I was alittle sore after the last session, but this morning im not doing too bad"    Currently in Pain? Yes   Pain Score 0-No pain   Pain Orientation Right;Left                         [facility] Adult PT Treatment/Exercise - 05/09/17 0809      Modalities   Modalities Iontophoresis     Iontophoresis   Type of Iontophoresis Dexamethasone   Location R plantar surface heel   Dose 22m/ml   Time 6 hour stat patch     Manual Therapy   Soft tissue mobilization IASTM over bil arches/ medial calcaneal tubecle, calves, twisting over bil medial cancaneal tubercles     Ankle Exercises: Stretches   Plantar Fascia Stretch 2 reps;30 seconds   Gastroc Stretch 2 reps;30 seconds     Ankle Exercises: Standing   Other Standing Ankle Exercises heel raise with met heads on towel 2 x 12           Trigger Point Dry Needling - 05/09/17 0813    Consent Given? Yes   Education Handout Provided No   Gastrocnemius Response Twitch response elicited;Palpable increased muscle length  bil   Soleus Response Twitch response elicited;Palpable increased  muscle length  bil              PT Education - 05/09/17 0904    Education provided Yes   Education Details iontophoresis education, benefits and length of wear.    Person(s) Educated Patient   Methods Explanation;Verbal cues;Handout   Comprehension Verbalized understanding;Verbal cues required          PT Short Term Goals - 04/19/17 0859      PT SHORT TERM GOAL #1   Title pt to be I with inital HEP    Time 3   Period Weeks   Status New   Target Date 05/10/17     PT SHORT TERM GOAL #2   Title pt to increase bil Df to >/= 3 degrees with </= 3/10 pain to promote mobility    Time 3   Period Weeks   Status New   Target Date 05/10/17           PT Long Term Goals - 04/19/17 0900      PT LONG TERM GOAL #1   Title increase posterior tib strength to >/= 4/5 to promote arch control/ stability with walking/ running activities    Time 6   Period Weeks   Status  New   Target Date 05/31/17     PT LONG TERM GOAL #2   Title pt to report no pain with walking/ standing and during the night and when getting out of bed to demonstrate improving condition   Time 6   Period Weeks   Status New   Target Date 05/31/17     PT LONG TERM GOAL #3   Title pt to increase DF to >/= 8 degrees bil to demonstrate decreased calf tightness and promote functional mobility for running and walking activities    Time 6   Period Weeks   Status New   Target Date 05/31/17     PT LONG TERM GOAL #4   Title pt to be able to run for >/= 10 min with </= 2/10 pain for personal goal of returning to running   Time 6   Period Weeks   Status New   Target Date 05/31/17     PT LONG TERM GOAL #5   Title pt to be I with all HEP given as of last visit    Time 6   Period Weeks   Status New   Target Date 05/31/17               Plan - 05/09/17 0907    Clinical Impression Statement pt reported decreased pain in the l foot today but continues have pain and tightness in the R foot. Continued  TPDN in gastroc/ soleus bil and continued IASTM over bil calves. she was able to do exercise with report of only soreness in the calf. educated and applied ionto patch to r foot.    PT Next Visit Plan update PRN, DN, how was ionto, IASTM techniques, post tib strengthening, balance tranining, heel raise with met head on towel   PT Home Exercise Plan previous HEP from [facility], toe yoga, posterior tib strengthening, and 4-way theraband strengthening.    Consulted and Agree with Plan of Care Patient      Patient will benefit from skilled therapeutic intervention in order to improve the following deficits and impairments:  Abnormal gait, Pain, Improper body mechanics, Postural dysfunction, Decreased strength, Decreased range of motion, Increased fascial restricitons, Increased muscle spasms, Decreased endurance, Decreased activity tolerance  Visit Diagnosis: Pain in left ankle and joints of left foot  Pain in right ankle and joints of right foot  Muscle weakness (generalized)  Other abnormalities of gait and mobility     Problem List Patient Active Problem List   Diagnosis Date Noted  . Bilateral foot pain 04/11/2017  . Abnormality of gait 01/03/2017  . 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 SI (sacroiliac) joint dysfunction 09/18/2015  . Nonallopathic lesion of sacral region 09/18/2015  . Nonallopathic lesion of pelvic region 09/18/2015  . Nonallopathic lesion of lumbosacral region 09/18/2015  . Epigastric abdominal tenderness without rebound tenderness 06/30/2015  . IBS (irritable bowel syndrome) 06/30/2015  . Allergic rhinitis 01/04/2013   [name], [name], LAT, [name]  05/09/17  9:10 AM      [facility] [address] [place], [name], [address] Phone: [phone]   Fax:  [phone]  [name] MRN: [id number] Date of Birth: Oct 13, 1980

## 2017-05-09 NOTE — Patient Instructions (Signed)

## 2017-05-17 ENCOUNTER — Encounter: Payer: 59 | Admitting: Physical Therapy

## 2017-05-19 ENCOUNTER — Encounter: Payer: 59 | Admitting: Physical Therapy

## 2017-05-23 ENCOUNTER — Ambulatory Visit: Payer: 59 | Admitting: Physical Therapy

## 2017-05-23 ENCOUNTER — Encounter: Payer: Self-pay | Admitting: Physical Therapy

## 2017-05-23 DIAGNOSIS — M25572 Pain in left ankle and joints of left foot: Secondary | ICD-10-CM

## 2017-05-23 DIAGNOSIS — R2689 Other abnormalities of gait and mobility: Secondary | ICD-10-CM

## 2017-05-23 DIAGNOSIS — M6281 Muscle weakness (generalized): Secondary | ICD-10-CM

## 2017-05-23 DIAGNOSIS — M25571 Pain in right ankle and joints of right foot: Secondary | ICD-10-CM

## 2017-05-23 NOTE — Therapy (Signed)
[place], [name], [address] Phone: [phone]   Fax:  [phone]  Physical Therapy Treatment  Patient Details  [name] MRN: [id number] Date of Birth: [birth year] Referring Provider: Hulan Saas DO  Encounter Date: 05/23/2017      PT End of Session - 05/23/17 0850    Visit Number 4   Number of Visits 13   Date for PT Re-Evaluation 06/14/17   PT Start Time 0803   PT Stop Time 0846   PT Time Calculation (min) 43 min   Activity Tolerance Patient tolerated treatment well   Behavior During Therapy [facility] for tasks assessed/performed      Past Medical History:  Diagnosis Date  . Allergy     History reviewed. No pertinent surgical history.  There were no vitals filed for this visit.      Subjective Assessment - 05/23/17 0808    Subjective "Today is more of rough day, I did alot of traveling so I haven't gotten alot of time to do exercises and stretching" pt reported trying to run which was fine until after the run.   Currently in Pain? Yes   Pain Score 4    Pain Location Foot   Pain Orientation Left;Right   Pain Descriptors / Indicators Sore;Aching   Pain Onset More than a month ago   Pain Frequency Intermittent   Aggravating Factors  standing/ walking, getting out of bed in the morning   Pain Relieving Factors wearing                         [facility] Adult PT Treatment/Exercise - 05/23/17 0821      Manual Therapy   Manual Therapy Taping   Soft tissue mobilization IASTM over bil arches/ medial calcaneal tubecle, calves, twisting over bil medial cancaneal tubercles   Kinesiotex Facilitate Muscle  arch taping bil     Kinesiotix   Facilitate Muscle  posterior tibialis bil     Ankle Exercises: Stretches   Plantar Fascia Stretch 2 reps;30 seconds   Gastroc Stretch 2 reps;30 seconds     Ankle Exercises: Seated   Towel Crunch 3 reps   Other Seated Ankle Exercises  toe yoga 2 x 10 bil          Trigger Point Dry Needling - 05/23/17 8937    Consent Given? Yes   Education Handout Provided No   Quadatus Plantae Response Twitch response elicited;Palpable increased muscle length                PT Short Term Goals - 04/19/17 0859      PT SHORT TERM GOAL #1   Title pt to be I with inital HEP    Time 3   Period Weeks   Status New   Target Date 05/10/17     PT SHORT TERM GOAL #2   Title pt to increase bil Df to >/= 3 degrees with </= 3/10 pain to promote mobility    Time 3   Period Weeks   Status New   Target Date 05/10/17           PT Long Term Goals - 04/19/17 0900      PT LONG TERM GOAL #1   Title increase posterior tib strength to >/= 4/5 to promote arch control/ stability with walking/ running activities    Time 6   Period Weeks   Status New   Target Date 05/31/17  PT LONG TERM GOAL #2   Title pt to report no pain with walking/ standing and during the night and when getting out of bed to demonstrate improving condition   Time 6   Period Weeks   Status New   Target Date 05/31/17     PT LONG TERM GOAL #3   Title pt to increase DF to >/= 8 degrees bil to demonstrate decreased calf tightness and promote functional mobility for running and walking activities    Time 6   Period Weeks   Status New   Target Date 05/31/17     PT LONG TERM GOAL #4   Title pt to be able to run for >/= 10 min with </= 2/10 pain for personal goal of returning to running   Time 6   Period Weeks   Status New   Target Date 05/31/17     PT LONG TERM GOAL #5   Title pt to be I with all HEP given as of last visit    Time 6   Period Weeks   Status New   Target Date 05/31/17               Plan - 05/23/17 0850    Clinical Impression Statement continued TPDN for bil quadratus Plantae followed with soft tissue techniques. pt performed stretching and exercise. trialed KT taping to support the arch and facilitate posterior tib. post  session she repoted soreness in the heels and decreased tightness inthe arch with standing/ walking.    PT Next Visit Plan update PRN, DN, how was ionto, IASTM techniques, post tib strengthening, balance tranining, heel raise with met head on towel, how was KT tape   Consulted and Agree with Plan of Care Patient      Patient will benefit from skilled therapeutic intervention in order to improve the following deficits and impairments:  Abnormal gait, Pain, Improper body mechanics, Postural dysfunction, Decreased strength, Decreased range of motion, Increased fascial restricitons, Increased muscle spasms, Decreased endurance, Decreased activity tolerance  Visit Diagnosis: Pain in left ankle and joints of left foot  Pain in right ankle and joints of right foot  Muscle weakness (generalized)  Other abnormalities of gait and mobility     Problem List Patient Active Problem List   Diagnosis Date Noted  . Bilateral foot pain 04/11/2017  . Abnormality of gait 01/03/2017  . 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 SI (sacroiliac) joint dysfunction 09/18/2015  . Nonallopathic lesion of sacral region 09/18/2015  . Nonallopathic lesion of pelvic region 09/18/2015  . Nonallopathic lesion of lumbosacral region 09/18/2015  . Epigastric abdominal tenderness without rebound tenderness 06/30/2015  . IBS (irritable bowel syndrome) 06/30/2015  . Allergic rhinitis 01/04/2013   [name], [name], LAT, [name]  05/23/17  8:53 AM      [facility] [address] [place], [name], [address] Phone: [phone]   Fax:  [phone]  Name: Barbarita Hutmacher MRN: [id number] Date of Birth: April 26, 1981

## 2017-05-25 ENCOUNTER — Encounter: Payer: Self-pay | Admitting: Physical Therapy

## 2017-05-25 ENCOUNTER — Ambulatory Visit: Payer: 59 | Admitting: Physical Therapy

## 2017-05-25 DIAGNOSIS — M25572 Pain in left ankle and joints of left foot: Secondary | ICD-10-CM | POA: Diagnosis not present

## 2017-05-25 DIAGNOSIS — R2689 Other abnormalities of gait and mobility: Secondary | ICD-10-CM

## 2017-05-25 DIAGNOSIS — M25571 Pain in right ankle and joints of right foot: Secondary | ICD-10-CM

## 2017-05-25 DIAGNOSIS — M6281 Muscle weakness (generalized): Secondary | ICD-10-CM

## 2017-05-25 NOTE — Therapy (Signed)
[place], [name], [address] Phone: [phone]   Fax:  [phone]  Physical Therapy Treatment  Patient Details  [name] MRN: [id number] Date of Birth: [birth year] Referring Provider: Hulan Saas DO  Encounter Date: 05/25/2017      PT End of Session - 05/25/17 0950    Visit Number 5   Number of Visits 13   Date for PT Re-Evaluation 06/14/17   PT Start Time 0806   PT Stop Time 0846   PT Time Calculation (min) 40 min   Activity Tolerance Patient tolerated treatment well   Behavior During Therapy [facility] for tasks assessed/performed      Past Medical History:  Diagnosis Date  . Allergy     History reviewed. No pertinent surgical history.  There were no vitals filed for this visit.      Subjective Assessment - 05/25/17 0808    Subjective "Im doing pretty good today, I did some running yesterday at 3 miles but haven't been trying to push myself too much"    Currently in Pain? Yes   Pain Score 3    Pain Orientation Left;Right   Pain Descriptors / Indicators Aching;Sore   Pain Onset More than a month ago   Pain Frequency Intermittent                         [facility] Adult PT Treatment/Exercise - 05/25/17 0830      Manual Therapy   Manual therapy comments manual trigger point release over bil quadratus plante  educated how to perform at home graduating pressure   Soft tissue mobilization IASTM over bil arches/ medial calcaneal tubecle, calves, twisting over bil medial cancaneal tubercles   Kinesiotex Facilitate Muscle  arch taping     Kinesiotix   Facilitate Muscle  posterior tibialis bil     Ankle Exercises: Stretches   Plantar Fascia Stretch 2 reps;30 seconds   Soleus Stretch 2 reps;30 seconds   Gastroc Stretch 2 reps;30 seconds     Ankle Exercises: Standing   Other Standing Ankle Exercises heel raise with met heads on towel 2 x 12                  PT Education - 05/25/17 0947    Education provided Yes   Education Details KT taping benefits and length of wear   Person(s) Educated Patient   Methods Explanation;Verbal cues;Handout   Comprehension Verbalized understanding;Verbal cues required          PT Short Term Goals - 05/25/17 0953      PT SHORT TERM GOAL #1   Title pt to be I with inital HEP    Time 3   Period Weeks   Status Achieved     PT SHORT TERM GOAL #2   Title pt to increase bil Df to >/= 3 degrees with </= 3/10 pain to promote mobility    Time 3   Period Weeks   Status On-going           PT Long Term Goals - 05/25/17 7262      PT LONG TERM GOAL #1   Title increase posterior tib strength to >/= 4/5 to promote arch control/ stability with walking/ running activities    Time 6   Period Weeks   Status On-going     PT LONG TERM GOAL #2   Title pt to report no pain with walking/ standing and  during the night and when getting out of bed to demonstrate improving condition   Time 6   Period Weeks   Status On-going     PT LONG TERM GOAL #3   Title pt to increase DF to >/= 8 degrees bil to demonstrate decreased calf tightness and promote functional mobility for running and walking activities    Time 6   Period Weeks   Status On-going     PT LONG TERM GOAL #4   Title pt to be able to run for >/= 10 min with </= 2/10 pain for personal goal of returning to running   Time 6   Period Weeks   Status On-going     PT LONG TERM GOAL #5   Title pt to be I with all HEP given as of last visit    Time 6   Period Weeks   Status On-going               Plan - 05/25/17 9150    Clinical Impression Statement pt reported decreased pain today compared last session. trialed MTPR over bil arch and continued working on hip strengthenign. continued KT tape which she reported improvement of pain at heel strike.    PT Treatment/Interventions ADLs/Self Care Home Management;Cryotherapy;Electrical  Stimulation;Iontophoresis 33m/ml Dexamethasone;Ultrasound;Therapeutic activities;Therapeutic exercise;Dry needling;Taping;Manual techniques;Patient/family education;Gait training;Moist Heat;Balance training   PT Next Visit Plan update PRN, DN, how was ionto, IASTM techniques, post tib strengthening, balance tranining, heel raise with met head on towel, how was KT tape   PT Home Exercise Plan previous HEP from [facility], toe yoga, posterior tib strengthening, and 4-way theraband strengthening. lateral band walks.    Consulted and Agree with Plan of Care Patient      Patient will benefit from skilled therapeutic intervention in order to improve the following deficits and impairments:  Abnormal gait, Pain, Improper body mechanics, Postural dysfunction, Decreased strength, Decreased range of motion, Increased fascial restricitons, Increased muscle spasms, Decreased endurance, Decreased activity tolerance  Visit Diagnosis: Pain in left ankle and joints of left foot  Pain in right ankle and joints of right foot  Muscle weakness (generalized)  Other abnormalities of gait and mobility     Problem List Patient Active Problem List   Diagnosis Date Noted  . Bilateral foot pain 04/11/2017  . Abnormality of gait 01/03/2017  . 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 SI (sacroiliac) joint dysfunction 09/18/2015  . Nonallopathic lesion of sacral region 09/18/2015  . Nonallopathic lesion of pelvic region 09/18/2015  . Nonallopathic lesion of lumbosacral region 09/18/2015  . Epigastric abdominal tenderness without rebound tenderness 06/30/2015  . IBS (irritable bowel syndrome) 06/30/2015  . Allergic rhinitis 01/04/2013   KStarr LakePT, [name], LAT, [name]  05/25/17  9:55 AM      [facility] [address]Phone: [phone]  Fax:  [phone] Name: Melanie OrtnerMRN: [id number] of Birth: 7April 18, 1982

## 2017-05-30 ENCOUNTER — Encounter: Payer: Self-pay | Admitting: Physical Therapy

## 2017-05-30 ENCOUNTER — Ambulatory Visit: Payer: 59 | Attending: Internal Medicine | Admitting: Physical Therapy

## 2017-05-30 DIAGNOSIS — M6281 Muscle weakness (generalized): Secondary | ICD-10-CM | POA: Insufficient documentation

## 2017-05-30 DIAGNOSIS — M25571 Pain in right ankle and joints of right foot: Secondary | ICD-10-CM | POA: Insufficient documentation

## 2017-05-30 DIAGNOSIS — R2689 Other abnormalities of gait and mobility: Secondary | ICD-10-CM | POA: Insufficient documentation

## 2017-05-30 DIAGNOSIS — M25572 Pain in left ankle and joints of left foot: Secondary | ICD-10-CM | POA: Diagnosis not present

## 2017-05-30 NOTE — Therapy (Signed)
[place], [name], [address] Phone: [phone]   Fax:  [phone]  Physical Therapy Treatment  Patient Details  [name] MRN: [id number] Date of Birth: 10-28-1980 Referring Provider: Hulan Saas DO  Encounter Date: 05/30/2017      PT End of Session - 05/30/17 0808    Visit Number 6   Number of Visits 13   Date for PT Re-Evaluation 06/14/17   PT Start Time 0805   PT Stop Time 1497   PT Time Calculation (min) 42 min   Activity Tolerance Patient tolerated treatment well   Behavior During Therapy [facility] for tasks assessed/performed      Past Medical History:  Diagnosis Date  . Allergy     History reviewed. No pertinent surgical history.  There were no vitals filed for this visit.      Subjective Assessment - 05/30/17 0805    Subjective "I had a really good week since the last, and I decided to take break from running"    Currently in Pain? Yes   Pain Score 2    Pain Location Foot   Pain Orientation Right;Left   Pain Descriptors / Indicators Aching;Sore   Pain Type Chronic pain   Pain Onset More than a month ago   Pain Frequency Intermittent   Aggravating Factors  running,    Pain Relieving Factors taping,             [facility] PT Assessment - 05/30/17 0811      AROM   Right Ankle Dorsiflexion 3   Right Ankle Plantar Flexion 75   Right Ankle Inversion 30   Right Ankle Eversion 22   Left Ankle Dorsiflexion 6   Left Ankle Plantar Flexion 70   Left Ankle Inversion 30   Left Ankle Eversion 13     Strength   Right Ankle Dorsiflexion 5/5   Right Ankle Plantar Flexion 5/5   Right Ankle Inversion 5/5   Right Ankle Eversion 5/5   Left Ankle Dorsiflexion 5/5   Left Ankle Plantar Flexion 5/5   Left Ankle Inversion 5/5   Left Ankle Eversion 5/5                     [facility] Adult PT Treatment/Exercise - 05/30/17 0830      Knee/Hip Exercises: Machines for  Strengthening   Hip Cybex bil hip abduction 2 x 15 bil with 35#     Knee/Hip Exercises: Seated   Sit to Sand 2 sets;10 reps;without UE support  with green theraband around the knees     Kinesiotix   Inhibit Muscle  arch taping bil     Ankle Exercises: Standing   Heel Raises 20 reps  bil with inversion for posterior tib strengthening.      Ankle Exercises: Stretches   Soleus Stretch 2 reps;30 seconds  slant board   Gastroc Stretch 2 reps;30 seconds  slant board                  PT Short Term Goals - 05/25/17 0953      PT SHORT TERM GOAL #1   Title pt to be I with inital HEP    Time 3   Period Weeks   Status Achieved     PT SHORT TERM GOAL #2   Title pt to increase bil Df to >/= 3 degrees with </= 3/10 pain to promote mobility    Time 3   Period  Weeks   Status On-going           PT Long Term Goals - 05/25/17 0953      PT LONG TERM GOAL #1   Title increase posterior tib strength to >/= 4/5 to promote arch control/ stability with walking/ running activities    Time 6   Period Weeks   Status On-going     PT LONG TERM GOAL #2   Title pt to report no pain with walking/ standing and during the night and when getting out of bed to demonstrate improving condition   Time 6   Period Weeks   Status On-going     PT LONG TERM GOAL #3   Title pt to increase DF to >/= 8 degrees bil to demonstrate decreased calf tightness and promote functional mobility for running and walking activities    Time 6   Period Weeks   Status On-going     PT LONG TERM GOAL #4   Title pt to be able to run for >/= 10 min with </= 2/10 pain for personal goal of returning to running   Time 6   Period Weeks   Status On-going     PT LONG TERM GOAL #5   Title pt to be I with all HEP given as of last visit    Time 6   Period Weeks   Status On-going               Plan - 05/30/17 0843    Clinical Impression Statement pt states she is doing better today with only 1-2/10, she  reports she has stopped running for now. due to pt reporting improvement in pain focused on hip strengthening and ankle/ foot strengthening promoting foot control. post session she reported no increase in pain.   PT Next Visit Plan update PRN, DN PRN, , IASTM techniques, post tib strengthening, balance tranining, heel raise with met head on towel, how was KT tape   PT Home Exercise Plan previous HEP from [facility], toe yoga, posterior tib strengthening, and 4-way theraband strengthening. lateral band walks. corner balance, and hip ER strengthening.    Consulted and Agree with Plan of Care Patient      Patient will benefit from skilled therapeutic intervention in order to improve the following deficits and impairments:  Abnormal gait, Pain, Improper body mechanics, Postural dysfunction, Decreased strength, Decreased range of motion, Increased fascial restricitons, Increased muscle spasms, Decreased endurance, Decreased activity tolerance  Visit Diagnosis: Pain in left ankle and joints of left foot  Pain in right ankle and joints of right foot  Muscle weakness (generalized)  Other abnormalities of gait and mobility     Problem List Patient Active Problem List   Diagnosis Date Noted  . Bilateral foot pain 04/11/2017  . Abnormality of gait 01/03/2017  . 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 SI (sacroiliac) joint dysfunction 09/18/2015  . Nonallopathic lesion of sacral region 09/18/2015  . Nonallopathic lesion of pelvic region 09/18/2015  . Nonallopathic lesion of lumbosacral region 09/18/2015  . Epigastric abdominal tenderness without rebound tenderness 06/30/2015  . IBS (irritable bowel syndrome) 06/30/2015  . Allergic rhinitis 01/04/2013   [name], [name], LAT, [name]  05/30/17  8:51 AM      [facility] [address] [place], [name], [address] Phone: [phone]   Fax:  [phone]  [name] MRN:  [id number] Date of Birth: [birth year]

## 2017-06-01 ENCOUNTER — Ambulatory Visit: Payer: 59 | Admitting: Physical Therapy

## 2017-06-01 ENCOUNTER — Encounter: Payer: Self-pay | Admitting: Physical Therapy

## 2017-06-01 DIAGNOSIS — R2689 Other abnormalities of gait and mobility: Secondary | ICD-10-CM

## 2017-06-01 DIAGNOSIS — M6281 Muscle weakness (generalized): Secondary | ICD-10-CM

## 2017-06-01 DIAGNOSIS — M25572 Pain in left ankle and joints of left foot: Secondary | ICD-10-CM | POA: Diagnosis not present

## 2017-06-01 DIAGNOSIS — M25571 Pain in right ankle and joints of right foot: Secondary | ICD-10-CM

## 2017-06-01 NOTE — Therapy (Signed)
[place], [name], [address] Phone: [phone]   Fax:  [phone]  Physical Therapy Treatment  Patient Details  [name] MRN: [id number] Date of Birth: 10-31-1980 Referring Provider: Hulan Saas DO  Encounter Date: 06/01/2017      PT End of Session - 06/01/17 0838    Visit Number 7   Number of Visits 13   Date for PT Re-Evaluation 06/14/17   PT Start Time 0803   PT Stop Time 0833  pt reported she had to leave early   PT Time Calculation (min) 30 min   Activity Tolerance Patient tolerated treatment well   Behavior During Therapy [facility] for tasks assessed/performed      Past Medical History:  Diagnosis Date  . Allergy     History reviewed. No pertinent surgical history.  There were no vitals filed for this visit.      Subjective Assessment - 06/01/17 0806    Subjective "I had some soreness with plyometric, some soreness in the  R heel"   Currently in Pain? Yes   Pain Score 1    Pain Location Foot   Pain Orientation Right;Left   Pain Descriptors / Indicators Aching   Pain Type Chronic pain   Pain Onset More than a month ago   Pain Frequency Intermittent                         [facility] Adult PT Treatment/Exercise - 06/01/17 0836      Knee/Hip Exercises: Machines for Strengthening   Hip Cybex bil hip abduction 2 x going to fatigue bil with 35#     Knee/Hip Exercises: Standing   Other Standing Knee Exercises bulgarian split squat 1 x 10 bil  tactile cues to correct pelvic drop on stance side     Knee/Hip Exercises: Supine   Single Leg Bridge 2 sets;5 reps;Both  cues to maintain level pelvis     Manual Therapy   Kinesiotex Facilitate Muscle     Kinesiotix   Inhibit Muscle  arch taping bil             Balance Exercises - 06/01/17 0837      Balance Exercises: Standing   SLS Eyes open;4 reps;Other (comment);30 secs;Foam/compliant surface  with  toe scrunch exercises for increased intrinsic strength             PT Short Term Goals - 05/25/17 0953      PT SHORT TERM GOAL #1   Title pt to be I with inital HEP    Time 3   Period Weeks   Status Achieved     PT SHORT TERM GOAL #2   Title pt to increase bil Df to >/= 3 degrees with </= 3/10 pain to promote mobility    Time 3   Period Weeks   Status On-going           PT Long Term Goals - 05/25/17 9476      PT LONG TERM GOAL #1   Title increase posterior tib strength to >/= 4/5 to promote arch control/ stability with walking/ running activities    Time 6   Period Weeks   Status On-going     PT LONG TERM GOAL #2   Title pt to report no pain with walking/ standing and during the night and when getting out of bed to demonstrate improving condition   Time 6   Period Weeks  Status On-going     PT LONG TERM GOAL #3   Title pt to increase DF to >/= 8 degrees bil to demonstrate decreased calf tightness and promote functional mobility for running and walking activities    Time 6   Period Weeks   Status On-going     PT LONG TERM GOAL #4   Title pt to be able to run for >/= 10 min with </= 2/10 pain for personal goal of returning to running   Time 6   Period Weeks   Status On-going     PT LONG TERM GOAL #5   Title pt to be I with all HEP given as of last visit    Time 6   Period Weeks   Status On-going               Plan - 06/01/17 0840    Clinical Impression Statement pt reported she had to leave early today. continued arch taping, pt continues to report decreased pain and tightness today. Due to shortened session focused on foot instrinsics and hip strengthening which she performed well with minimal verbal/ tactile cues on proper form.    PT Next Visit Plan look at running form, IASTM techniques, post tib strengthening, balance tranining, heel raise with met head on towel, how was KT tape   PT Home Exercise Plan previous HEP from [facility], toe yoga,  posterior tib strengthening, and 4-way theraband strengthening. lateral band walks. corner balance, and hip ER strengthening.    Consulted and Agree with Plan of Care Patient      Patient will benefit from skilled therapeutic intervention in order to improve the following deficits and impairments:  Abnormal gait, Pain, Improper body mechanics, Postural dysfunction, Decreased strength, Decreased range of motion, Increased fascial restricitons, Increased muscle spasms, Decreased endurance, Decreased activity tolerance  Visit Diagnosis: Pain in left ankle and joints of left foot  Pain in right ankle and joints of right foot  Muscle weakness (generalized)  Other abnormalities of gait and mobility     Problem List Patient Active Problem List   Diagnosis Date Noted  . Bilateral foot pain 04/11/2017  . Abnormality of gait 01/03/2017  . 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 SI (sacroiliac) joint dysfunction 09/18/2015  . Nonallopathic lesion of sacral region 09/18/2015  . Nonallopathic lesion of pelvic region 09/18/2015  . Nonallopathic lesion of lumbosacral region 09/18/2015  . Epigastric abdominal tenderness without rebound tenderness 06/30/2015  . IBS (irritable bowel syndrome) 06/30/2015  . Allergic rhinitis 01/04/2013   [name], [name], LAT, [name]  06/01/17  8:45 AM      [facility] [address] [place], [name], [address] Phone: [phone]   Fax:  [phone]  [name] MRN: [id number] Date of Birth: March 25, 1981

## 2017-06-06 ENCOUNTER — Ambulatory Visit: Payer: 59 | Admitting: Physical Therapy

## 2017-06-06 ENCOUNTER — Encounter: Payer: Self-pay | Admitting: Physical Therapy

## 2017-06-06 DIAGNOSIS — M25572 Pain in left ankle and joints of left foot: Secondary | ICD-10-CM | POA: Diagnosis not present

## 2017-06-06 DIAGNOSIS — R2689 Other abnormalities of gait and mobility: Secondary | ICD-10-CM

## 2017-06-06 DIAGNOSIS — M6281 Muscle weakness (generalized): Secondary | ICD-10-CM

## 2017-06-06 DIAGNOSIS — M25571 Pain in right ankle and joints of right foot: Secondary | ICD-10-CM

## 2017-06-06 NOTE — Therapy (Signed)
[place], [name], [address] Phone: [phone]   Fax:  [phone]  Physical Therapy Treatment  Patient Details  [name] MRN: [id number] Date of Birth: [birth year] Referring Provider: Hulan Saas DO  Encounter Date: 06/06/2017      PT End of Session - 06/06/17 0901    Visit Number 8   Number of Visits 13   Date for PT Re-Evaluation 06/14/17   PT Start Time 0806   PT Stop Time 0845   PT Time Calculation (min) 39 min   Activity Tolerance Patient tolerated treatment well   Behavior During Therapy [facility] for tasks assessed/performed      Past Medical History:  Diagnosis Date  . Allergy     History reviewed. No pertinent surgical history.  There were no vitals filed for this visit.      Subjective Assessment - 06/06/17 0808    Subjective "I had a pretty sore day the other day and I am not sure what I did"    Currently in Pain? Yes   Pain Location Foot   Pain Orientation Right;Left   Pain Descriptors / Indicators Aching   Pain Type Chronic pain   Pain Frequency Intermittent   Aggravating Factors  running   Pain Relieving Factors taping, stretching                         [facility] Adult PT Treatment/Exercise - 06/06/17 0856      Self-Care   Self-Care Other Self-Care Comments   Other Self-Care Comments  running mechanics working on shortening stride, and avoiding hitting the ground with loud noise. increase knee bend for improved push-off     Knee/Hip Exercises: Standing   Gait Training running in places with 10# 4 x 30 sec     Knee/Hip Exercises: Seated   Other Seated Knee/Hip Exercises 1/2 kneeling on airex pad, 2 x 30 with head turns     Ankle Exercises: Aerobic   Tread Mill 6 min @ 6.0  analyzing running pattern     Ankle Exercises: Stretches   Soleus Stretch 2 reps;30 seconds   Gastroc Stretch 2 reps;30 seconds     Ankle Exercises: Standing   Other  Standing Ankle Exercises running 4 x 50 ft  with verbal cues for proper stride                  PT Short Term Goals - 05/25/17 0953      PT SHORT TERM GOAL #1   Title pt to be I with inital HEP    Time 3   Period Weeks   Status Achieved     PT SHORT TERM GOAL #2   Title pt to increase bil Df to >/= 3 degrees with </= 3/10 pain to promote mobility    Time 3   Period Weeks   Status On-going           PT Long Term Goals - 05/25/17 1761      PT LONG TERM GOAL #1   Title increase posterior tib strength to >/= 4/5 to promote arch control/ stability with walking/ running activities    Time 6   Period Weeks   Status On-going     PT LONG TERM GOAL #2   Title pt to report no pain with walking/ standing and during the night and when getting out of bed to demonstrate improving condition   Time 6  Period Weeks   Status On-going     PT LONG TERM GOAL #3   Title pt to increase DF to >/= 8 degrees bil to demonstrate decreased calf tightness and promote functional mobility for running and walking activities    Time 6   Period Weeks   Status On-going     PT LONG TERM GOAL #4   Title pt to be able to run for >/= 10 min with </= 2/10 pain for personal goal of returning to running   Time 6   Period Weeks   Status On-going     PT LONG TERM GOAL #5   Title pt to be I with all HEP given as of last visit    Time 6   Period Weeks   Status On-going               Plan - 06/06/17 0901    Clinical Impression Statement pt reports 1/10 pain. focused todays session on assessing running form / pattern. pt exhibits signifcant pronation with running and hits in at mid stance but hits with increased for bil. pt reported decreased pain and tightness with decreased stride and increased bending of the knee.    PT Next Visit Plan hows shortened stride running, IASTM techniques, post tib strengthening, balance tranining, heel raise with met head on towel, dyanmic activities.     PT Home Exercise Plan previous HEP from [place], toe yoga, posterior tib strengthening, and 4-way theraband strengthening. lateral band walks. corner balance, and hip ER strengthening. how to tape KT      Patient will benefit from skilled therapeutic intervention in order to improve the following deficits and impairments:  Abnormal gait, Pain, Improper body mechanics, Postural dysfunction, Decreased strength, Decreased range of motion, Increased fascial restricitons, Increased muscle spasms, Decreased endurance, Decreased activity tolerance  Visit Diagnosis: Pain in left ankle and joints of left foot  Pain in right ankle and joints of right foot  Muscle weakness (generalized)  Other abnormalities of gait and mobility     Problem List Patient Active Problem List   Diagnosis Date Noted  . Bilateral foot pain 04/11/2017  . Abnormality of gait 01/03/2017  . 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 SI (sacroiliac) joint dysfunction 09/18/2015  . Nonallopathic lesion of sacral region 09/18/2015  . Nonallopathic lesion of pelvic region 09/18/2015  . Nonallopathic lesion of lumbosacral region 09/18/2015  . Epigastric abdominal tenderness without rebound tenderness 06/30/2015  . IBS (irritable bowel syndrome) 06/30/2015  . Allergic rhinitis 01/04/2013    [name], [name], LAT, [name]  06/06/17  9:18 AM      [facility] [facility] [address] [place], [name], [address] Phone: [phone]   Fax:  [phone]  [name] MRN: [id number] Date of Birth: [birth year]

## 2017-06-08 ENCOUNTER — Encounter: Payer: Self-pay | Admitting: Physical Therapy

## 2017-06-08 ENCOUNTER — Ambulatory Visit: Payer: 59 | Admitting: Physical Therapy

## 2017-06-08 DIAGNOSIS — M6281 Muscle weakness (generalized): Secondary | ICD-10-CM

## 2017-06-08 DIAGNOSIS — M25571 Pain in right ankle and joints of right foot: Secondary | ICD-10-CM

## 2017-06-08 DIAGNOSIS — M25572 Pain in left ankle and joints of left foot: Secondary | ICD-10-CM

## 2017-06-08 DIAGNOSIS — R2689 Other abnormalities of gait and mobility: Secondary | ICD-10-CM

## 2017-06-08 NOTE — Therapy (Addendum)
[place], [name], [address] Phone: [phone]   Fax:  [phone]  Physical Therapy Treatment / Re-certification  Patient Details  [name] MRN: [id number] Date of Birth: 08/21/1981 Referring Provider: Hulan Saas DO  Encounter Date: 06/08/2017      PT End of Session - 06/08/17 0842    Visit Number 9   Number of Visits 16   Date for PT Re-Evaluation 07/20/17   PT Start Time 0806  pt arrived 6 minutes late today   PT Stop Time 0846   PT Time Calculation (min) 40 min   Activity Tolerance Patient tolerated treatment well   Behavior During Therapy [facility] for tasks assessed/performed      Past Medical History:  Diagnosis Date  . Allergy     History reviewed. No pertinent surgical history.  There were no vitals filed for this visit.      Subjective Assessment - 06/08/17 0807    Subjective "I had alot of soreness yesterday"   Currently in Pain? Yes   Pain Score 4    Pain Location Foot   Pain Orientation Right;Left            [facility] PT Assessment - 06/08/17 0817      AROM   Right Ankle Dorsiflexion 4   Right Ankle Plantar Flexion 62   Right Ankle Inversion 26   Right Ankle Eversion 18   Left Ankle Dorsiflexion 6   Left Ankle Plantar Flexion 68   Left Ankle Inversion 28   Left Ankle Eversion 18                     [facility] Adult PT Treatment/Exercise - 06/08/17 0839      Knee/Hip Exercises: Sidelying   Clams 2 x 10 performed bil   with 4# tactile cues for proper hip form   Other Sidelying Knee/Hip Exercises reverse clam shell 2 x 15 with 4# performed bil     Manual Therapy   Manual Therapy Joint mobilization   Manual therapy comments manual trigger point release over bil quadratus plante, halluciss flexor/abductor bil   Joint Mobilization grade 5 ankle distractionmanipulation bil    Soft tissue mobilization IASTM over bil arches/ medial calcaneal tubecle,  calves, twisting over bil medial cancaneal tubercles   Kinesiotex Facilitate Muscle     Kinesiotix   Inhibit Muscle  arch taping bil     Ankle Exercises: Stretches   Gastroc Stretch 2 reps;30 seconds  slant board             Balance Exercises - 06/08/17 0944      Balance Exercises: Standing   Other Standing Exercises SLS cone taps 2 x 10 ea.             PT Short Term Goals - 06/08/17 0946      PT SHORT TERM GOAL #1   Title pt to be I with inital HEP    Time 3   Period Weeks   Status Achieved     PT SHORT TERM GOAL #2   Title pt to increase bil Df to >/= 3 degrees with </= 3/10 pain to promote mobility    Time 3   Period Weeks   Status Partially Met           PT Long Term Goals - 06/08/17 0946      PT LONG TERM GOAL #1   Title increase posterior tib strength to >/=  4/5 to promote arch control/ stability with walking/ running activities    Time 6   Period Weeks   Status Partially Met     PT LONG TERM GOAL #2   Title pt to report no pain with walking/ standing and during the night and when getting out of bed to demonstrate improving condition   Time 6   Period Weeks   Status Partially Met     PT LONG TERM GOAL #3   Title pt to increase DF to >/= 8 degrees bil to demonstrate decreased calf tightness and promote functional mobility for running and walking activities    Time 6   Period Weeks   Status On-going     PT LONG TERM GOAL #4   Title pt to be able to run for >/= 10 min with </= 2/10 pain for personal goal of returning to running   Time 6   Status Partially Met     PT LONG TERM GOAL #5   Title pt to be I with all HEP given as of last visit    Time 6   Period Weeks   Status On-going               Plan - 06/08/17 0945    Clinical Impression Statement pt reported increased soreness in the L foot mor than the R with 4/10 pain this morning. continued hip strengthening and balance training techniques. she is progressing well with  goals. She would benefit from continued physical therapy to decrease pain and improve running mechanics, hip/ ankle strengthening and provide stability.     Rehab Potential Good   PT Frequency 2x / week   PT Duration 4 weeks   PT Treatment/Interventions ADLs/Self Care Home Management;Cryotherapy;Electrical Stimulation;Iontophoresis 14m/ml Dexamethasone;Ultrasound;Therapeutic activities;Therapeutic exercise;Dry needling;Taping;Manual techniques;Patient/family education;Gait training;Moist Heat;Balance training   PT Next Visit Plan hows shortened stride running, IASTM techniques, post tib strengthening, balance tranining, heel raise with met head on towel, dyanmic activities.    PT Home Exercise Plan previous HEP from [place], toe yoga, posterior tib strengthening, and 4-way theraband strengthening. lateral band walks. corner balance, and hip ER strengthening. how to tape KT   Consulted and Agree with Plan of Care Patient      Patient will benefit from skilled therapeutic intervention in order to improve the following deficits and impairments:  Abnormal gait, Pain, Improper body mechanics, Postural dysfunction, Decreased strength, Decreased range of motion, Increased fascial restricitons, Increased muscle spasms, Decreased endurance, Decreased activity tolerance  Visit Diagnosis: Pain in left ankle and joints of left foot - Plan: PT plan of care cert/re-cert  Pain in right ankle and joints of right foot - Plan: PT plan of care cert/re-cert  Muscle weakness (generalized) - Plan: PT plan of care cert/re-cert  Other abnormalities of gait and mobility - Plan: PT plan of care cert/re-cert     Problem List Patient Active Problem List   Diagnosis Date Noted  . Bilateral foot pain 04/11/2017  . Abnormality of gait 01/03/2017  . 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 SI (sacroiliac) joint dysfunction 09/18/2015  . Nonallopathic lesion of sacral region 09/18/2015  . Nonallopathic lesion of pelvic  region 09/18/2015  . Nonallopathic lesion of lumbosacral region 09/18/2015  . Epigastric abdominal tenderness without rebound tenderness 06/30/2015  . IBS (irritable bowel syndrome) 06/30/2015  . Allergic rhinitis 01/04/2013   KStarr LakePT, [name], LAT, [name]  06/08/17  9:57 AM      [facility]  [place], [name], [address] Phone: [phone]   Fax:  [phone]  [name] MRN: [id number] Date of Birth: Mar 09, 1981

## 2017-06-11 ENCOUNTER — Other Ambulatory Visit: Payer: Self-pay | Admitting: Family Medicine

## 2017-06-12 NOTE — Telephone Encounter (Signed)
reifll done.

## 2017-06-14 ENCOUNTER — Encounter: Payer: Self-pay | Admitting: Physical Therapy

## 2017-06-14 ENCOUNTER — Ambulatory Visit: Payer: 59 | Admitting: Physical Therapy

## 2017-06-14 DIAGNOSIS — M25571 Pain in right ankle and joints of right foot: Secondary | ICD-10-CM

## 2017-06-14 DIAGNOSIS — M6281 Muscle weakness (generalized): Secondary | ICD-10-CM

## 2017-06-14 DIAGNOSIS — R2689 Other abnormalities of gait and mobility: Secondary | ICD-10-CM

## 2017-06-14 DIAGNOSIS — M25572 Pain in left ankle and joints of left foot: Secondary | ICD-10-CM

## 2017-06-14 NOTE — Therapy (Signed)
[place], [name], [address] Phone: [phone]   Fax:  [phone]  Physical Therapy Treatment  Patient Details  [name] MRN: [id number] Date of Birth: [birth year] Referring Provider: Hulan Saas DO  Encounter Date: 06/14/2017      PT End of Session - 06/14/17 0915    Visit Number 10   Number of Visits 16   Date for PT Re-Evaluation 07/20/17   PT Start Time 0849   PT Stop Time 0931   PT Time Calculation (min) 42 min   Activity Tolerance Patient tolerated treatment well   Behavior During Therapy [facility] for tasks assessed/performed      Past Medical History:  Diagnosis Date  . Allergy     History reviewed. No pertinent surgical history.  There were no vitals filed for this visit.      Subjective Assessment - 06/14/17 0850    Subjective "I had alittle bit of soreness the other day but I think I am still having to calm down the running"    Currently in Pain? Yes   Pain Score 2    Pain Location Foot   Pain Orientation Right;Left   Pain Descriptors / Indicators Aching   Pain Type Chronic pain   Pain Onset More than a month ago   Pain Frequency Intermittent   Aggravating Factors  running,    Pain Relieving Factors taping, stretching                         [facility] Adult PT Treatment/Exercise - 06/14/17 0855      Iontophoresis   Type of Iontophoresis Dexamethasone   Location L plantar surface heel   Dose 3m/ml   Time 6 hour stat patch     Manual Therapy   Joint Mobilization grade 5 ankle distractionmanipulation bil    Soft tissue mobilization DTM rolling over therabar x 5 min bil   Kinesiotex Facilitate Muscle     Ankle Exercises: Aerobic   Stationary Bike L3 x 5 min     Ankle Exercises: Seated   Marble Pickup 2 x bil with 1:30 time limit to promote inceased flexor           Trigger Point Dry Needling - 06/14/17 0858    Consent Given? Yes   Education Handout Provided No   Quadatus Plantae Response Twitch response elicited;Palpable increased muscle length                PT Short Term Goals - 06/08/17 0946      PT SHORT TERM GOAL #1   Title pt to be I with inital HEP    Time 3   Period Weeks   Status Achieved     PT SHORT TERM GOAL #2   Title pt to increase bil Df to >/= 3 degrees with </= 3/10 pain to promote mobility    Time 3   Period Weeks   Status Partially Met           PT Long Term Goals - 06/08/17 0946      PT LONG TERM GOAL #1   Title increase posterior tib strength to >/= 4/5 to promote arch control/ stability with walking/ running activities    Time 6   Period Weeks   Status Partially Met     PT LONG TERM GOAL #2   Title pt to report no pain with walking/ standing and during the night and  when getting out of bed to demonstrate improving condition   Time 6   Period Weeks   Status Partially Met     PT LONG TERM GOAL #3   Title pt to increase DF to >/= 8 degrees bil to demonstrate decreased calf tightness and promote functional mobility for running and walking activities    Time 6   Period Weeks   Status On-going     PT LONG TERM GOAL #4   Title pt to be able to run for >/= 10 min with </= 2/10 pain for personal goal of returning to running   Time 6   Status Partially Met     PT LONG TERM GOAL #5   Title pt to be I with all HEP given as of last visit    Time 6   Period Weeks   Status On-going               Plan - 06/14/17 6503    Clinical Impression Statement Chritina states she has been praticgin her running with shorter stride but still has pain after running so she plans to halt running for alittle bit for healing. due to pt not feeling well continued TPDN for bil archs, followed with DTM techniques. continued work on Industrial/product designer. Post session she reported decreased pain.   PT Next Visit Plan hows shortened stride running, IASTM techniques, post tib  strengthening, balance tranining, heel raise with met head on towel, dyanmic activities.    PT Home Exercise Plan previous HEP from [facility], toe yoga, posterior tib strengthening, and 4-way theraband strengthening. lateral band walks. corner balance, and hip ER strengthening. how to tape KT   Consulted and Agree with Plan of Care Patient      Patient will benefit from skilled therapeutic intervention in order to improve the following deficits and impairments:  Abnormal gait, Pain, Improper body mechanics, Postural dysfunction, Decreased strength, Decreased range of motion, Increased fascial restricitons, Increased muscle spasms, Decreased endurance, Decreased activity tolerance  Visit Diagnosis: Pain in left ankle and joints of left foot  Pain in right ankle and joints of right foot  Muscle weakness (generalized)  Other abnormalities of gait and mobility     Problem List Patient Active Problem List   Diagnosis Date Noted  . Bilateral foot pain 04/11/2017  . Abnormality of gait 01/03/2017  . 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 SI (sacroiliac) joint dysfunction 09/18/2015  . Nonallopathic lesion of sacral region 09/18/2015  . Nonallopathic lesion of pelvic region 09/18/2015  . Nonallopathic lesion of lumbosacral region 09/18/2015  . Epigastric abdominal tenderness without rebound tenderness 06/30/2015  . IBS (irritable bowel syndrome) 06/30/2015  . Allergic rhinitis 01/04/2013    [name], [name], LAT, [name]  06/14/17  9:31 AM      [facility] [address] [place], [name], [address] Phone: [phone]   Fax:  [phone]  [name] MRN: [id number] Date of Birth: 1981/01/17

## 2017-06-19 ENCOUNTER — Ambulatory Visit: Payer: 59 | Admitting: Physical Therapy

## 2017-06-19 ENCOUNTER — Encounter: Payer: Self-pay | Admitting: Physical Therapy

## 2017-06-19 DIAGNOSIS — R2689 Other abnormalities of gait and mobility: Secondary | ICD-10-CM

## 2017-06-19 DIAGNOSIS — M25572 Pain in left ankle and joints of left foot: Secondary | ICD-10-CM | POA: Diagnosis not present

## 2017-06-19 DIAGNOSIS — M6281 Muscle weakness (generalized): Secondary | ICD-10-CM

## 2017-06-19 DIAGNOSIS — M25571 Pain in right ankle and joints of right foot: Secondary | ICD-10-CM

## 2017-06-19 NOTE — Therapy (Signed)
[place], [name], [address] Phone: [phone]   Fax:  [phone]  Physical Therapy Treatment  Patient Details  [name] MRN: [id number] Date of Birth: 10-Aug-1981 Referring Provider: Hulan Saas DO  Encounter Date: 06/19/2017      PT End of Session - 06/19/17 0932    Visit Number 11   Number of Visits 16   Date for PT Re-Evaluation 07/20/17   PT Start Time 0806   PT Stop Time 0847   PT Time Calculation (min) 41 min   Activity Tolerance Patient tolerated treatment well   Behavior During Therapy [facility] for tasks assessed/performed      Past Medical History:  Diagnosis Date  . Allergy     History reviewed. No pertinent surgical history.  There were no vitals filed for this visit.      Subjective Assessment - 06/19/17 0807    Subjective "I am doing pretty good, but I did drop my laptop on my foot the other day"    Currently in Pain? Yes   Pain Score 2    Pain Location Foot   Pain Orientation Right   Pain Descriptors / Indicators Aching   Pain Type Chronic pain                         [facility] Adult PT Treatment/Exercise - 06/19/17 0809      Knee/Hip Exercises: Plyometrics   Bilateral Jumping 2 sets;10 reps  with ball between knees to prevent medial collapse   Bilateral Jumping Limitations verbal/ cues for proper recoil and absoprotion of landing     Knee/Hip Exercises: Supine   Bridges Limitations 2 x 10 with heels on foam roll     Knee/Hip Exercises: Sidelying   Hip ABduction Strengthening;Both;15 reps;2 sets;Right;Left  with 3-4 sec eccentric lowering   Other Sidelying Knee/Hip Exercises hip external rotation 2 x 15 bil with 3#     Iontophoresis   Type of Iontophoresis Dexamethasone   Location R plantar surface heel   Dose 78m/ml   Time 6 hour stat patch     Manual Therapy   Manual Therapy Muscle Energy Technique   Muscle Energy Technique scissor  technique with resisted hamstring curl on the L and resisted R hip flexion 10 x 10 sec hold     Ankle Exercises: Aerobic   Elliptical L4 x 6 min     Ankle Exercises: Stretches   Soleus Stretch 2 reps;30 seconds  slant board   Gastroc Stretch 2 reps;30 seconds  slant board                PT Education - 06/19/17 0932    Education provided Yes   Education Details jumping mechanics with proper form and avoiding favoring R LE   Person(s) Educated Patient   Methods Explanation;Verbal cues   Comprehension Verbalized understanding;Verbal cues required          PT Short Term Goals - 06/08/17 0946      PT SHORT TERM GOAL #1   Title pt to be I with inital HEP    Time 3   Period Weeks   Status Achieved     PT SHORT TERM GOAL #2   Title pt to increase bil Df to >/= 3 degrees with </= 3/10 pain to promote mobility    Time 3   Period Weeks   Status Partially Met  PT Long Term Goals - 06/08/17 0946      PT LONG TERM GOAL #1   Title increase posterior tib strength to >/= 4/5 to promote arch control/ stability with walking/ running activities    Time 6   Period Weeks   Status Partially Met     PT LONG TERM GOAL #2   Title pt to report no pain with walking/ standing and during the night and when getting out of bed to demonstrate improving condition   Time 6   Period Weeks   Status Partially Met     PT LONG TERM GOAL #3   Title pt to increase DF to >/= 8 degrees bil to demonstrate decreased calf tightness and promote functional mobility for running and walking activities    Time 6   Period Weeks   Status On-going     PT LONG TERM GOAL #4   Title pt to be able to run for >/= 10 min with </= 2/10 pain for personal goal of returning to running   Time 6   Status Partially Met     PT LONG TERM GOAL #5   Title pt to be I with all HEP given as of last visit    Time 6   Period Weeks   Status On-going               Plan - 06/19/17 0932     Clinical Impression Statement pt reports 2/10 in the arches today. continued working on hip strengthening to promote distal control. began jump training which she required verbal / visual cues for proper form and avoid favoring the RLE shfting hips to the L. continued ionto for pain/ inflammation in the R foot/ arch. worked on L hip SIJ pain which she demonstrates possible anterior innominate rotation on the L.    PT Next Visit Plan hows shortened stride running, innominate rotation anterior on L, IASTM techniques, post tib strengthening, balance tranining, heel raise with met head on towel, dyanmic activities.    PT Home Exercise Plan previous HEP from [facility], toe yoga, posterior tib strengthening, and 4-way theraband strengthening. lateral band walks. corner balance, and hip ER strengthening. how to tape KT   Consulted and Agree with Plan of Care Patient      Patient will benefit from skilled therapeutic intervention in order to improve the following deficits and impairments:  Abnormal gait, Pain, Improper body mechanics, Postural dysfunction, Decreased strength, Decreased range of motion, Increased fascial restricitons, Increased muscle spasms, Decreased endurance, Decreased activity tolerance  Visit Diagnosis: Pain in left ankle and joints of left foot  Pain in right ankle and joints of right foot  Other abnormalities of gait and mobility  Muscle weakness (generalized)     Problem List Patient Active Problem List   Diagnosis Date Noted  . Bilateral foot pain 04/11/2017  . Abnormality of gait 01/03/2017  . 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 SI (sacroiliac) joint dysfunction 09/18/2015  . Nonallopathic lesion of sacral region 09/18/2015  . Nonallopathic lesion of pelvic region 09/18/2015  . Nonallopathic lesion of lumbosacral region 09/18/2015  . Epigastric abdominal tenderness without rebound tenderness 06/30/2015  . IBS (irritable bowel syndrome) 06/30/2015  . Allergic  rhinitis 01/04/2013    [name], [name], LAT, [name]  06/19/17  9:35 AM      [facility] [address] [place], [name], [address] Phone: [phone]   Fax:  [phone]  [name]  MRN: [id number] Date of Birth: May 07, 1981

## 2017-06-23 ENCOUNTER — Ambulatory Visit: Payer: 59 | Admitting: Physical Therapy

## 2017-06-23 ENCOUNTER — Ambulatory Visit: Payer: Self-pay

## 2017-06-23 ENCOUNTER — Ambulatory Visit (INDEPENDENT_AMBULATORY_CARE_PROVIDER_SITE_OTHER): Payer: 59 | Admitting: Family Medicine

## 2017-06-23 ENCOUNTER — Encounter: Payer: Self-pay | Admitting: Family Medicine

## 2017-06-23 ENCOUNTER — Ambulatory Visit: Payer: Self-pay | Admitting: Family Medicine

## 2017-06-23 ENCOUNTER — Encounter: Payer: Self-pay | Admitting: Physical Therapy

## 2017-06-23 VITALS — BP 110/86 | HR 75 | Wt 127.0 lb

## 2017-06-23 DIAGNOSIS — R2689 Other abnormalities of gait and mobility: Secondary | ICD-10-CM

## 2017-06-23 DIAGNOSIS — M79671 Pain in right foot: Secondary | ICD-10-CM

## 2017-06-23 DIAGNOSIS — M25571 Pain in right ankle and joints of right foot: Secondary | ICD-10-CM

## 2017-06-23 DIAGNOSIS — M79672 Pain in left foot: Principal | ICD-10-CM

## 2017-06-23 DIAGNOSIS — M25572 Pain in left ankle and joints of left foot: Secondary | ICD-10-CM | POA: Diagnosis not present

## 2017-06-23 DIAGNOSIS — M6281 Muscle weakness (generalized): Secondary | ICD-10-CM

## 2017-06-23 NOTE — Therapy (Signed)
Melanie Villa, [name], [address] Phone: [phone]   Fax:  [phone]  Physical Therapy Treatment  Patient Details  [name] MRN: [id number] Date of Birth: May 07, 1981 Referring Provider: Hulan Saas DO  Encounter Date: 06/23/2017      PT End of Session - 06/23/17 0946    Visit Number 12   Number of Visits 16   Date for PT Re-Evaluation 07/20/17   PT Start Time 0848   PT Stop Time 0931   PT Time Calculation (min) 43 min   Activity Tolerance Patient tolerated treatment well      Past Medical History:  Diagnosis Date  . Allergy     History reviewed. No pertinent surgical history.  There were no vitals filed for this visit.      Subjective Assessment - 06/23/17 0851    Subjective "I am doing pretty, and the soreness from last session calmed down"    Currently in Pain? Yes   Pain Score 2    Pain Location Foot   Pain Orientation Right   Pain Onset More than a month ago   Pain Frequency Intermittent   Aggravating Factors  running                         [facility] Adult PT Treatment/Exercise - 06/23/17 0858      Knee/Hip Exercises: Plyometrics   Bilateral Jumping 2 sets;10 reps   Bilateral Jumping Limitations verbal/ cues for proper recoil and absoprotion of landing   Unilateral Jumping 10 reps;4 sets  4 square jumping: RSLE x 2 L SLS      Knee/Hip Exercises: Standing   Other Standing Knee Exercises lateral band walks 4 x 20  with blue band     Ankle Exercises: Aerobic   Elliptical L4 x 6 min @ elevation L 3     Ankle Exercises: Stretches   Soleus Stretch 2 reps;30 seconds   Gastroc Stretch 2 reps;30 seconds             Balance Exercises - 06/23/17 0941      Balance Exercises: Standing   SLS Eyes open;Foam/compliant surface;30 secs;2 reps  2 x 10 SLS with cone taps 1 x reaching close, 1 x reaching             PT Short Term Goals - 06/08/17  0946      PT SHORT TERM GOAL #1   Title pt to be I with inital HEP    Time 3   Period Weeks   Status Achieved     PT SHORT TERM GOAL #2   Title pt to increase bil Df to >/= 3 degrees with </= 3/10 pain to promote mobility    Time 3   Period Weeks   Status Partially Met           PT Long Term Goals - 06/08/17 0946      PT LONG TERM GOAL #1   Title increase posterior tib strength to >/= 4/5 to promote arch control/ stability with walking/ running activities    Time 6   Period Weeks   Status Partially Met     PT LONG TERM GOAL #2   Title pt to report no pain with walking/ standing and during the night and when getting out of bed to demonstrate improving condition   Time 6   Period Weeks   Status Partially Met  PT LONG TERM GOAL #3   Title pt to increase DF to >/= 8 degrees bil to demonstrate decreased calf tightness and promote functional mobility for running and walking activities    Time 6   Period Weeks   Status On-going     PT LONG TERM GOAL #4   Title pt to be able to run for >/= 10 min with </= 2/10 pain for personal goal of returning to running   Time 6   Status Partially Met     PT LONG TERM GOAL #5   Title pt to be I with all HEP given as of last visit    Time 6   Period Weeks   Status On-going               Plan - 06/23/17 0947    Clinical Impression Statement pt continues to make progress with PT reporting decreased pain. continued hip strengthening and balance training activities. progressing jumping activities to small single limb hops which she fatigued quickly with but reported minimal soreness. pt declined modalities post session.    PT Treatment/Interventions ADLs/Self Care Home Management;Cryotherapy;Electrical Stimulation;Iontophoresis 61m/ml Dexamethasone;Ultrasound;Therapeutic activities;Therapeutic exercise;Dry needling;Taping;Manual techniques;Patient/family education;Gait training;Moist Heat;Balance training   PT Next Visit Plan  hows shortened stride running, innominate rotation anterior on L, IASTM techniques, post tib strengthening, balance tranining, heel raise with met head on towel, dyanmic activities.    PT Home Exercise Plan previous HEP from [facility], toe yoga, posterior tib strengthening, and 4-way theraband strengthening. lateral band walks. corner balance, and hip ER strengthening. how to tape KT   Consulted and Agree with Plan of Care Patient      Patient will benefit from skilled therapeutic intervention in order to improve the following deficits and impairments:  Abnormal gait, Pain, Improper body mechanics, Postural dysfunction, Decreased strength, Decreased range of motion, Increased fascial restricitons, Increased muscle spasms, Decreased endurance, Decreased activity tolerance  Visit Diagnosis: Pain in left ankle and joints of left foot  Pain in right ankle and joints of right foot  Other abnormalities of gait and mobility  Muscle weakness (generalized)     Problem List Patient Active Problem List   Diagnosis Date Noted  . Bilateral foot pain 04/11/2017  . Abnormality of gait 01/03/2017  . 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 SI (sacroiliac) joint dysfunction 09/18/2015  . Nonallopathic lesion of sacral region 09/18/2015  . Nonallopathic lesion of pelvic region 09/18/2015  . Nonallopathic lesion of lumbosacral region 09/18/2015  . Epigastric abdominal tenderness without rebound tenderness 06/30/2015  . IBS (irritable bowel syndrome) 06/30/2015  . Allergic rhinitis 01/04/2013    KStarr LakePT, [name], LAT, [name]  06/23/17  9:49 AM      [facility] [place] [address]Phone: [phone]  Fax:  [phone] Name: CClora OhmerMRN: [id number] of Birth: [birth year]

## 2017-06-23 NOTE — Patient Instructions (Signed)
Good to see you  Hope you enjoy your new tattoo See me again in 8 weeks

## 2017-06-23 NOTE — Progress Notes (Signed)
[name] [place], [place] [address] Phone: [phone] Subjective:    I'm seeing this patient by the request  of:    CC: Back pain, bilateral foot pain  JKD:[id number]  Melanie Villa is a 36 y.o. female coming in for follow up for her foot and back. She has been doing physical therapy which has helped the feet but it has been slow. She has not been running.   Patient is seen me for back pain previously. Significant tightness of the hip flexors and sacroiliac dysfunction. Has responded well to osteopathic manipulation. Patient states still having discomfort and pain. Patient denies any radiation of pain.  Patient's pain seems to be worsening. Has had an abnormal plantar fasciitis for some time. Has been doing physical therapy with no significant improvement. Patient is concerned because she has had to stop running second or the pain.    Past Medical History:  Diagnosis Date  . Allergy    No past surgical history on file. Social History   Social History  . Marital status: Single    Spouse name: N/A  . Number of children: N/A  . Years of education: N/A   Social History Main Topics  . Smoking status: Former Research scientist (life sciences)  . Smokeless tobacco: Never Used  . Alcohol use 3.0 oz/week    5 Glasses of wine per week  . Drug use: No  . Sexual activity: Yes    Birth control/ protection: None   Other Topics Concern  . Not on file   Social History Narrative  . No narrative on file   Allergies  Allergen Reactions  . Amoxicillin    Family History  Problem Relation Age of Onset  . Hypertension Mother   . Alcohol abuse Neg Hx   . Asthma Neg Hx   . Cancer Neg Hx   . COPD Neg Hx   . Depression Neg Hx   . Diabetes Neg Hx   . Drug abuse Neg Hx   . Early death Neg Hx   . Hearing loss Neg Hx   . Heart disease Neg Hx   . Hyperlipidemia Neg Hx   . Kidney disease Neg Hx   . Stroke Neg Hx      Past medical history, social, surgical and  family history all reviewed in electronic medical record.  No pertanent information unless stated regarding to the chief complaint.   Review of Systems:Review of systems updated and as accurate as of 06/23/17  No headache, visual changes, nausea, vomiting, diarrhea, constipation, dizziness, abdominal pain, skin rash, fevers, chills, night sweats, weight loss, swollen lymph nodes, body aches, joint swelling, muscle aches, chest pain, shortness of breath, mood changes.   Objective  There were no vitals taken for this visit. Systems examined below as of 06/23/17   General: No apparent distress alert and oriented x3 mood and affect normal, dressed appropriately.  HEENT: Pupils equal, extraocular movements intact  Respiratory: Patient's speak in full sentences and does not appear short of breath  Cardiovascular: No lower extremity edema, non tender, no erythema  Skin: Warm dry intact with no signs of infection or rash on extremities or on axial skeleton.  Abdomen: Soft nontender  Neuro: Cranial nerves II through XII are intact, neurovascularly intact in all extremities with 2+ DTRs and 2+ pulses.  Lymph: No lymphadenopathy of posterior or anterior cervical chain or axillae bilaterally.  Gait normal with good balance and coordination.  MSK:  Non tender with  full range of motion and good stability and symmetric strength and tone of shoulders, elbows, wrist, hip, knee and ankles bilaterally.  Back Exam:  Inspection: Unremarkable  Motion: Flexion 45 deg, Extension 45 deg, Side Bending to 45 deg bilaterally,  Rotation to 45 deg bilaterally  SLR laying: Negative  XSLR laying: Negative  Palpable tenderness: Tenderness to palpation in the sacroiliac joints bilaterally. FABER: Positive left. Sensory change: Gross sensation intact to all lumbar and sacral dermatomes.  Reflexes: 2+ at both patellar tendons, 2+ at achilles tendons, Babinski's downgoing.  Strength at foot  Plantar-flexion: 5/5  Dorsi-flexion: 5/5 Eversion: 5/5 Inversion: 5/5  Leg strength  Quad: 5/5 Hamstring: 5/5 Hip flexor: 5/5 Hip abductors: 5/5  Gait unremarkable. Foot exam shows the patient does have bilateral foot pain. Narrow foot. Patient does have transverse arch breakdown. Patient's pain seems to be on the medial portion of the plantar fascia. Severely tender in this area bilaterally right greater left. Mild overpronation of the hindfoot bilaterally  Procedure: Real-time Ultrasound Guided Injection of right plantar fascial Device: GE Logiq Q7 Ultrasound guided injection is preferred based studies that show increased duration, increased effect, greater accuracy, decreased procedural pain, increased response rate, and decreased cost with ultrasound guided versus blind injection.  Verbal informed consent obtained.  Time-out conducted.  Noted no overlying erythema, induration, or other signs of local infection.  Skin prepped in a sterile fashion.  Local anesthesia: Topical Ethyl chloride.  With sterile technique and under real time ultrasound guidance:  With a 25-gauge half-inch needle was injected with 0.5 mL of 0.5% Marcaine and 0.5 mL of Kenalog 41 g/dL. Completed without difficulty  Pain immediately resolved suggesting accurate placement of the medication.  Advised to call if fevers/chills, erythema, induration, drainage, or persistent bleeding.  Images permanently stored and available for review in the ultrasound unit.  Impression: Technically successful ultrasound guided injection.  Procedure: Real-time Ultrasound Guided Injection of left plantar fascial origin Device: GE Logiq Q7 Ultrasound guided injection is preferred based studies that show increased duration, increased effect, greater accuracy, decreased procedural pain, increased response rate, and decreased cost with ultrasound guided versus blind injection.  Verbal informed consent obtained.  Time-out conducted.  Noted no overlying erythema,  induration, or other signs of local infection.  Skin prepped in a sterile fashion.  Local anesthesia: Topical Ethyl chloride.  With sterile technique and under real time ultrasound guidance:  With a 25-gauge half-inch needle was injected with 0.5 mL of 0.5% Marcaine and 0.5 mL of Kenalog 41 g/dL. Completed without difficulty  Pain immediately resolved suggesting accurate placement of the medication.  Advised to call if fevers/chills, erythema, induration, drainage, or persistent bleeding.  Images permanently stored and available for review in the ultrasound unit.  Impression: Technically successful ultrasound guided injection.   Impression and Recommendations:     This case required medical decision making of moderate complexity.      Note: This dictation was prepared with Dragon dictation along with smaller phrase technology. Any transcriptional errors that result from this process are unintentional.

## 2017-06-23 NOTE — Assessment & Plan Note (Signed)
Bilateral injections given today. Tolerated the procedures well. Patient was having worsening symptoms. Encourage her to continue to wear the orthotics on possible. Continue home exercises. Hopefully patient will respond well.

## 2017-06-26 ENCOUNTER — Ambulatory Visit: Payer: 59 | Admitting: Physical Therapy

## 2017-06-26 ENCOUNTER — Encounter: Payer: Self-pay | Admitting: Physical Therapy

## 2017-06-26 DIAGNOSIS — M25572 Pain in left ankle and joints of left foot: Secondary | ICD-10-CM | POA: Diagnosis not present

## 2017-06-26 DIAGNOSIS — R2689 Other abnormalities of gait and mobility: Secondary | ICD-10-CM

## 2017-06-26 DIAGNOSIS — M6281 Muscle weakness (generalized): Secondary | ICD-10-CM

## 2017-06-26 DIAGNOSIS — M25571 Pain in right ankle and joints of right foot: Secondary | ICD-10-CM

## 2017-06-26 NOTE — Therapy (Signed)
[place], [name], [address] Phone: [phone]   Fax:  [phone]  Physical Therapy Treatment  Patient Details  [name] MRN: [id number] Date of Birth: 01/08/1981 Referring Provider: Hulan Saas DO  Encounter Date: 06/26/2017      PT End of Session - 06/26/17 0847    Visit Number 13   Number of Visits 16   Date for PT Re-Evaluation 07/20/17   PT Start Time 0807   PT Stop Time 0846   PT Time Calculation (min) 39 min   Activity Tolerance Patient tolerated treatment well   Behavior During Therapy [facility] for tasks assessed/performed      Past Medical History:  Diagnosis Date  . Allergy     History reviewed. No pertinent surgical history.  There were no vitals filed for this visit.      Subjective Assessment - 06/26/17 0810    Subjective "I did get an injection in foot heels and reported relief of pain in the L foot, mild soreness in the L foot this morning"   Currently in Pain? Yes   Pain Score 1    Pain Orientation Right                         [facility] Adult PT Treatment/Exercise - 06/26/17 0827      Knee/Hip Exercises: Plyometrics   Bilateral Jumping 2 sets  1 min using trampoline   Unilateral Jumping 2 sets  bil 30 sec each on trampoline     Knee/Hip Exercises: Standing   Other Standing Knee Exercises hip hinge 2 x 10 bil with 10# kettle bell (1 set standing on green theraband)     Ankle Exercises: Supine   T-Band 4-way ankle strengthening 1 x 25 in each direction performed bil  with blue theraband     Ankle Exercises: Stretches   Soleus Stretch 2 reps;30 seconds  1 set before exercise, 1 set after tx   Gastroc Stretch 2 reps;30 seconds  1 set before exercise, 1 set after tx                  PT Short Term Goals - 06/08/17 0946      PT SHORT TERM GOAL #1   Title pt to be I with inital HEP    Time 3   Period Weeks   Status Achieved      PT SHORT TERM GOAL #2   Title pt to increase bil Df to >/= 3 degrees with </= 3/10 pain to promote mobility    Time 3   Period Weeks   Status Partially Met           PT Long Term Goals - 06/08/17 0946      PT LONG TERM GOAL #1   Title increase posterior tib strength to >/= 4/5 to promote arch control/ stability with walking/ running activities    Time 6   Period Weeks   Status Partially Met     PT LONG TERM GOAL #2   Title pt to report no pain with walking/ standing and during the night and when getting out of bed to demonstrate improving condition   Time 6   Period Weeks   Status Partially Met     PT LONG TERM GOAL #3   Title pt to increase DF to >/= 8 degrees bil to demonstrate decreased calf tightness and promote functional mobility for running and walking  activities    Time 6   Period Weeks   Status On-going     PT LONG TERM GOAL #4   Title pt to be able to run for >/= 10 min with </= 2/10 pain for personal goal of returning to running   Time 6   Status Partially Met     PT LONG TERM GOAL #5   Title pt to be I with all HEP given as of last visit    Time 6   Period Weeks   Status On-going               Plan - 06/26/17 0849    Clinical Impression Statement pt reports getting an injection in bil heels with relief of pain. Focused on ankle stability exercises and hip strengthening promoting endurance training with increased reps. pt was able to perform bil / single jumping exercises using trampoline reporting burning but denied pain. upgraded pt theraband to blue and discussed benefits of exercise consistency.    PT Treatment/Interventions ADLs/Self Care Home Management;Cryotherapy;Electrical Stimulation;Iontophoresis 31m/ml Dexamethasone;Ultrasound;Therapeutic activities;Therapeutic exercise;Dry needling;Taping;Manual techniques;Patient/family education;Gait training;Moist Heat;Balance training   PT Next Visit Plan hows shortened stride running, IASTM  techniques PRN, post tib strengthening, balance tranining, heel raise with met head on towel, dyanmic activities. endurance training.    PT Home Exercise Plan previous HEP from [facility], toe yoga, posterior tib strengthening, and 4-way theraband strengthening. lateral band walks. corner balance, and hip ER strengthening. how to tape KT   Consulted and Agree with Plan of Care Patient      Patient will benefit from skilled therapeutic intervention in order to improve the following deficits and impairments:  Abnormal gait, Pain, Improper body mechanics, Postural dysfunction, Decreased strength, Decreased range of motion, Increased fascial restricitons, Increased muscle spasms, Decreased endurance, Decreased activity tolerance  Visit Diagnosis: Pain in left ankle and joints of left foot  Pain in right ankle and joints of right foot  Other abnormalities of gait and mobility  Muscle weakness (generalized)     Problem List Patient Active Problem List   Diagnosis Date Noted  . Bilateral foot pain 04/11/2017  . Abnormality of gait 01/03/2017  . 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 SI (sacroiliac) joint dysfunction 09/18/2015  . Nonallopathic lesion of sacral region 09/18/2015  . Nonallopathic lesion of pelvic region 09/18/2015  . Nonallopathic lesion of lumbosacral region 09/18/2015  . Epigastric abdominal tenderness without rebound tenderness 06/30/2015  . IBS (irritable bowel syndrome) 06/30/2015  . Allergic rhinitis 01/04/2013   Melanie LakePT, [name], LAT, [name]  06/26/17  8:55 AM      [name]GFowl[address] [place] [address]Phone: [phone]  Fax:  [phone] Name: Melanie SiebenMRN: [id number] of Birth: 704/11/1980

## 2017-06-30 ENCOUNTER — Ambulatory Visit: Payer: Self-pay | Attending: Family Medicine | Admitting: Physical Therapy

## 2017-06-30 DIAGNOSIS — M6281 Muscle weakness (generalized): Secondary | ICD-10-CM | POA: Insufficient documentation

## 2017-06-30 DIAGNOSIS — M25571 Pain in right ankle and joints of right foot: Secondary | ICD-10-CM | POA: Insufficient documentation

## 2017-06-30 DIAGNOSIS — M25572 Pain in left ankle and joints of left foot: Secondary | ICD-10-CM | POA: Insufficient documentation

## 2017-06-30 DIAGNOSIS — R2689 Other abnormalities of gait and mobility: Secondary | ICD-10-CM | POA: Insufficient documentation

## 2017-07-03 ENCOUNTER — Ambulatory Visit: Payer: Self-pay | Admitting: Physical Therapy

## 2017-07-03 ENCOUNTER — Encounter: Payer: Self-pay | Admitting: Physical Therapy

## 2017-07-03 DIAGNOSIS — M6281 Muscle weakness (generalized): Secondary | ICD-10-CM

## 2017-07-03 DIAGNOSIS — M25572 Pain in left ankle and joints of left foot: Secondary | ICD-10-CM

## 2017-07-03 DIAGNOSIS — M25571 Pain in right ankle and joints of right foot: Secondary | ICD-10-CM

## 2017-07-03 DIAGNOSIS — R2689 Other abnormalities of gait and mobility: Secondary | ICD-10-CM

## 2017-07-03 NOTE — Therapy (Signed)
[place], [name], [address] Phone: [phone]   Fax:  [phone]  Physical Therapy Treatment  Patient Details  [name] MRN: [id number] Date of Birth: [birth year] Referring Provider: Hulan Saas DO   Encounter Date: 07/03/2017  PT End of Session - 07/03/17 0819    Visit Number  14    Number of Visits  16    Date for PT Re-Evaluation  07/20/17    PT Start Time  0812 pt arrived 12 min late   pt arrived 12 min late   PT Stop Time  0845    PT Time Calculation (min)  33 min    Activity Tolerance  Patient tolerated treatment well    Behavior During Therapy  [facility] for tasks assessed/performed       Past Medical History:  Diagnosis Date  . Allergy     No past surgical history on file.  There were no vitals filed for this visit.  Subjective Assessment - 07/03/17 0813    Subjective  "since the injection, I haven't been having as much pain, only mild soreness with plyometric.     Currently in Pain?  Yes    Pain Score  0-No pain    Aggravating Factors   lateral jumping    Pain Relieving Factors  taping, stretch                      [facility] Adult PT Treatment/Exercise - 07/03/17 0820      Knee/Hip Exercises: Plyometrics   Bilateral Jumping  4 sets 4 hops per set, with visual cues for equal landing/ push off   4 hops per set, with visual cues for equal landing/ push off   Other Plyometric Exercises  running in place with resistance with black theraband 3 x 1 min      Ankle Exercises: Aerobic   Elliptical  Stair stepper L5 x 5 min      Ankle Exercises: Stretches   Soleus Stretch  2 reps;30 seconds slant board   slant board   Gastroc Stretch  2 reps;30 seconds slant board   slant board     Ankle Exercises: Standing   Heel Raises  -- 1 x 50 on airex pad with toes pointed in for posterior tib   1 x 50 on airex pad with toes pointed in for posterior tib   Other Standing  Ankle Exercises  standing on airex pad SLS with toe scrunch 2 x 20 bil               PT Short Term Goals - 06/08/17 0946      PT SHORT TERM GOAL #1   Title  pt to be I with inital HEP     Time  3    Period  Weeks    Status  Achieved      PT SHORT TERM GOAL #2   Title  pt to increase bil Df to >/= 3 degrees with </= 3/10 pain to promote mobility     Time  3    Period  Weeks    Status  Partially Met        PT Long Term Goals - 06/08/17 0946      PT LONG TERM GOAL #1   Title  increase posterior tib strength to >/= 4/5 to promote arch control/ stability with walking/ running activities     Time  6    Period  Weeks    Status  Partially Met      PT LONG TERM GOAL #2   Title  pt to report no pain with walking/ standing and during the night and when getting out of bed to demonstrate improving condition    Time  6    Period  Weeks    Status  Partially Met      PT LONG TERM GOAL #3   Title  pt to increase DF to >/= 8 degrees bil to demonstrate decreased calf tightness and promote functional mobility for running and walking activities     Time  6    Period  Weeks    Status  On-going      PT LONG TERM GOAL #4   Title  pt to be able to run for >/= 10 min with </= 2/10 pain for personal goal of returning to running    Time  6    Status  Partially Met      PT LONG TERM GOAL #5   Title  pt to be I with all HEP given as of last visit     Time  6    Period  Weeks    Status  On-going            Plan - 07/03/17 0849    Clinical Impression Statement  pt arrived 12 minutes late today. continued focus on foot intrinsic support and extrinsic strengthening which she performed well. progress with running/ jumping mechanics which she conitnues to push up and land more on the RLE. post session she reported no pain.        Patient will benefit from skilled therapeutic intervention in order to improve the following deficits and impairments:     Visit Diagnosis: Pain in  left ankle and joints of left foot  Pain in right ankle and joints of right foot  Other abnormalities of gait and mobility  Muscle weakness (generalized)     Problem List Patient Active Problem List   Diagnosis Date Noted  . Bilateral foot pain 04/11/2017  . Abnormality of gait 01/03/2017  . 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 SI (sacroiliac) joint dysfunction 09/18/2015  . Nonallopathic lesion of sacral region 09/18/2015  . Nonallopathic lesion of pelvic region 09/18/2015  . Nonallopathic lesion of lumbosacral region 09/18/2015  . Epigastric abdominal tenderness without rebound tenderness 06/30/2015  . IBS (irritable bowel syndrome) 06/30/2015  . Allergic rhinitis 01/04/2013   [name], [name], LAT, [name]  07/03/17  8:55 AM      [facility] [address] [place], [name], [address] Phone: [phone]   Fax:  [phone]  Name: Shonette Rhames MRN: [id number] Date of Birth: 12/02/1980

## 2017-07-05 ENCOUNTER — Ambulatory Visit: Payer: Self-pay | Admitting: Physical Therapy

## 2017-07-11 ENCOUNTER — Encounter: Payer: Self-pay | Admitting: Physical Therapy

## 2017-07-11 ENCOUNTER — Ambulatory Visit: Payer: Self-pay | Admitting: Physical Therapy

## 2017-07-11 DIAGNOSIS — M25572 Pain in left ankle and joints of left foot: Secondary | ICD-10-CM

## 2017-07-11 DIAGNOSIS — M6281 Muscle weakness (generalized): Secondary | ICD-10-CM

## 2017-07-11 DIAGNOSIS — R2689 Other abnormalities of gait and mobility: Secondary | ICD-10-CM

## 2017-07-11 DIAGNOSIS — M25571 Pain in right ankle and joints of right foot: Secondary | ICD-10-CM

## 2017-07-11 NOTE — Therapy (Signed)
[place], [name], [address] Phone: [phone]   Fax:  [phone]  Physical Therapy Treatment / discharge summary  Patient Details  [name] MRN: [id number] Date of Birth: Apr 08, 1981 Referring Provider: Hulan Saas DO   Encounter Date: 07/11/2017  PT End of Session - 07/11/17 0821    Visit Number  15    Number of Visits  16    Date for PT Re-Evaluation  07/20/17    PT Start Time  0811 check in error pt arrived at 2    PT Stop Time  0846    PT Time Calculation (min)  35 min    Activity Tolerance  Patient tolerated treatment well    Behavior During Therapy  [facility] for tasks assessed/performed       Past Medical History:  Diagnosis Date  . Allergy     History reviewed. No pertinent surgical history.  There were no vitals filed for this visit.  Subjective Assessment - 07/11/17 0814    Subjective  "I am doing much better and pretty pleased with how things are going"    Currently in Pain?  No/denies    Pain Score  0-No pain    Aggravating Factors   plyometric activites    Pain Relieving Factors  stretching.          [facility] PT Assessment - 07/11/17 0816      Observation/Other Assessments   Focus on Therapeutic Outcomes (FOTO)   1% limited      AROM   Right Ankle Dorsiflexion  7    Right Ankle Plantar Flexion  72    Right Ankle Inversion  24    Right Ankle Eversion  18    Left Ankle Dorsiflexion  8    Left Ankle Plantar Flexion  70    Left Ankle Inversion  28    Left Ankle Eversion  18      Strength   Right Ankle Dorsiflexion  5/5    Right Ankle Plantar Flexion  5/5    Right Ankle Inversion  5/5    Right Ankle Eversion  5/5    Left Ankle Dorsiflexion  5/5    Left Ankle Plantar Flexion  5/5    Left Ankle Inversion  5/5    Left Ankle Eversion  5/5                  [facility] Adult PT Treatment/Exercise - 07/11/17 0827      Ankle Exercises: Aerobic    Elliptical  Stair stepper L5 x 5 min      Ankle Exercises: Stretches   Soleus Stretch  2 reps;30 seconds slant board    Gastroc Stretch  2 reps;30 seconds slant board      Ankle Exercises: Plyometrics   Plyometric Exercises  running forward 4 x 50 ft with shortened stride focusing on bending the knee to accept ground reaction forces. back pedaling 4 x 15 with cues to increase speed gradually.             PT Education - 07/11/17 0911    Education provided  Yes    Education Details  reviewed previously provided HEP, benefits of continued execise with emphasis on stretching. Gradual progression to running using interval training to gradually imrpove endurance and working on stride and form. If she feels pain with running to stop and reassess form.     Person(s) Educated  Patient    Methods  Explanation;Verbal cues    Comprehension  Verbalized understanding;Verbal cues required       PT Short Term Goals - 07/11/17 0905      PT SHORT TERM GOAL #1   Title  pt to be I with inital HEP     Time  3    Period  Weeks    Status  Achieved      PT SHORT TERM GOAL #2   Title  pt to increase bil Df to >/= 3 degrees with </= 3/10 pain to promote mobility     Time  3    Period  Weeks    Status  Achieved        PT Long Term Goals - 07/11/17 4481      PT LONG TERM GOAL #1   Title  increase posterior tib strength to >/= 4/5 to promote arch control/ stability with walking/ running activities     Time  6    Period  Weeks    Status  Achieved      PT LONG TERM GOAL #2   Title  pt to report no pain with walking/ standing and during the night and when getting out of bed to demonstrate improving condition    Time  6    Period  Weeks    Status  Achieved      PT LONG TERM GOAL #3   Title  pt to increase DF to >/= 8 degrees bil to demonstrate decreased calf tightness and promote functional mobility for running and walking activities     Time  6    Period  Weeks    Status  Partially Met       PT LONG TERM GOAL #4   Title  pt to be able to run for >/= 10 min with </= 2/10 pain for personal goal of returning to running    Time  6    Status  Partially Met      PT LONG TERM GOAL #5   Title  pt to be I with all HEP given as of last visit     Time  6    Period  Weeks    Status  Partially Met            Plan - 07/11/17 8563    Clinical Impression Statement  pt arrived 11 minutes late due to a check in error. she reports no pain today. she has increased her ankle ROM, and strength, and additionally reports no pain during activity. she met or partially met all goals today. She was able to do all exercises and running activities with no report of pain inthe heel. She is able to maintain and progress her current level of function independenlty and will be D/C from PT today.     PT Treatment/Interventions  ADLs/Self Care Home Management;Cryotherapy;Electrical Stimulation;Iontophoresis 41m/ml Dexamethasone;Ultrasound;Therapeutic activities;Therapeutic exercise;Dry needling;Taping;Manual techniques;Patient/family education;Gait training;Moist Heat;Balance training    PT Next Visit Plan  D/C    PT Home Exercise Plan  previous HEP from [facility], toe yoga, posterior tib strengthening, and 4-way theraband strengthening. lateral band walks. corner balance, and hip ER strengthening. how to tape KT    Consulted and Agree with Plan of Care  Patient       Patient will benefit from skilled therapeutic intervention in order to improve the following deficits and impairments:  Abnormal gait, Pain, Improper body mechanics, Postural dysfunction, Decreased strength, Decreased range of motion, Increased fascial restricitons, Increased muscle spasms,  Decreased endurance, Decreased activity tolerance  Visit Diagnosis: Pain in left ankle and joints of left foot  Pain in right ankle and joints of right foot  Other abnormalities of gait and mobility  Muscle weakness (generalized)     Problem  List Patient Active Problem List   Diagnosis Date Noted  . Bilateral foot pain 04/11/2017  . Abnormality of gait 01/03/2017  . 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 SI (sacroiliac) joint dysfunction 09/18/2015  . Nonallopathic lesion of sacral region 09/18/2015  . Nonallopathic lesion of pelvic region 09/18/2015  . Nonallopathic lesion of lumbosacral region 09/18/2015  . Epigastric abdominal tenderness without rebound tenderness 06/30/2015  . IBS (irritable bowel syndrome) 06/30/2015  . Allergic rhinitis 01/04/2013    [name], [name], LAT, [name]  07/11/17  9:14 AM      [place] [facility] [address] [place], [name], [address] Phone: [phone]   Fax:  [phone]  [name] MRN: [id number] Date of Birth: 1981/03/05       PHYSICAL THERAPY DISCHARGE SUMMARY  Visits from Start of Care: 15  Current functional level related to goals / functional outcomes: See goals, FOTO 1% limited   Remaining deficits: Intermittent soreness with plyometric activity    Education / Equipment: HEP, theraband, posture, running mechanics  Plan: Patient agrees to discharge.  Patient goals were not met. Patient is being discharged due to meeting the stated rehab goals.  ?????    Tiajuana Leppanen PT, [name], LAT, [name]  07/11/17  9:15 AM

## 2017-08-27 IMAGING — DX DG LUMBAR SPINE COMPLETE 4+V
5 series · 5 of 5 positions shown · non-contrast
Comparison: None.

CLINICAL DATA: Low back pain mainly in sacrum and coccyx area.
Intermittent for 2 years. No known injury.

EXAM:
LUMBAR SPINE - COMPLETE 4+ VIEW

[l-spine ap]
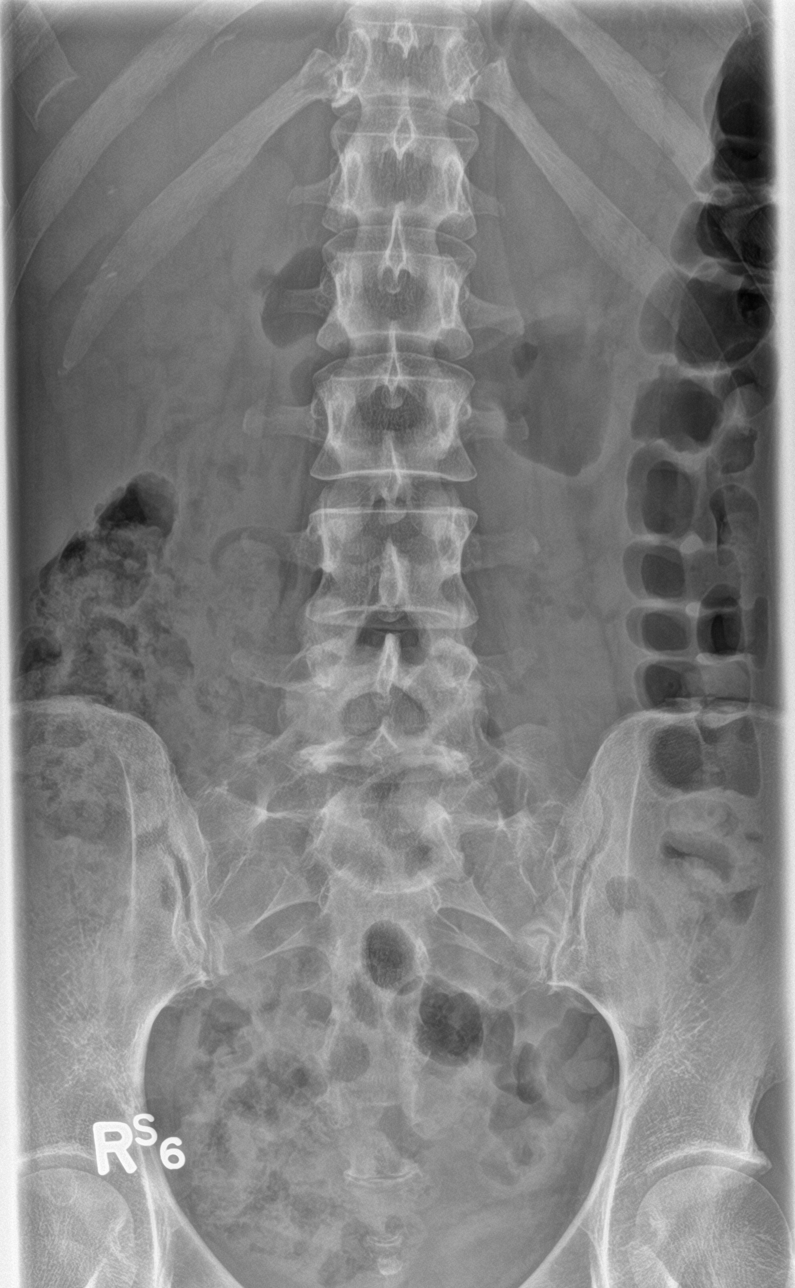

[l-spine obl (1 of 2)]
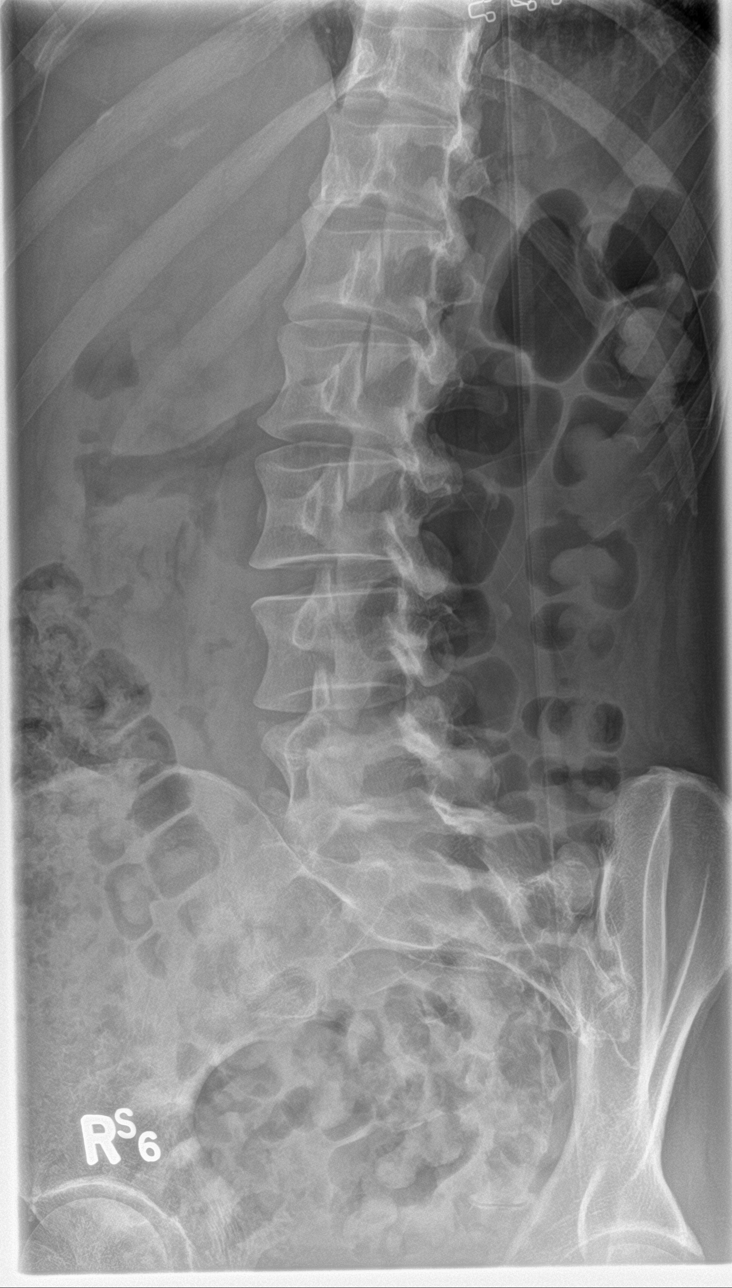

[l-spine obl (2 of 2)]
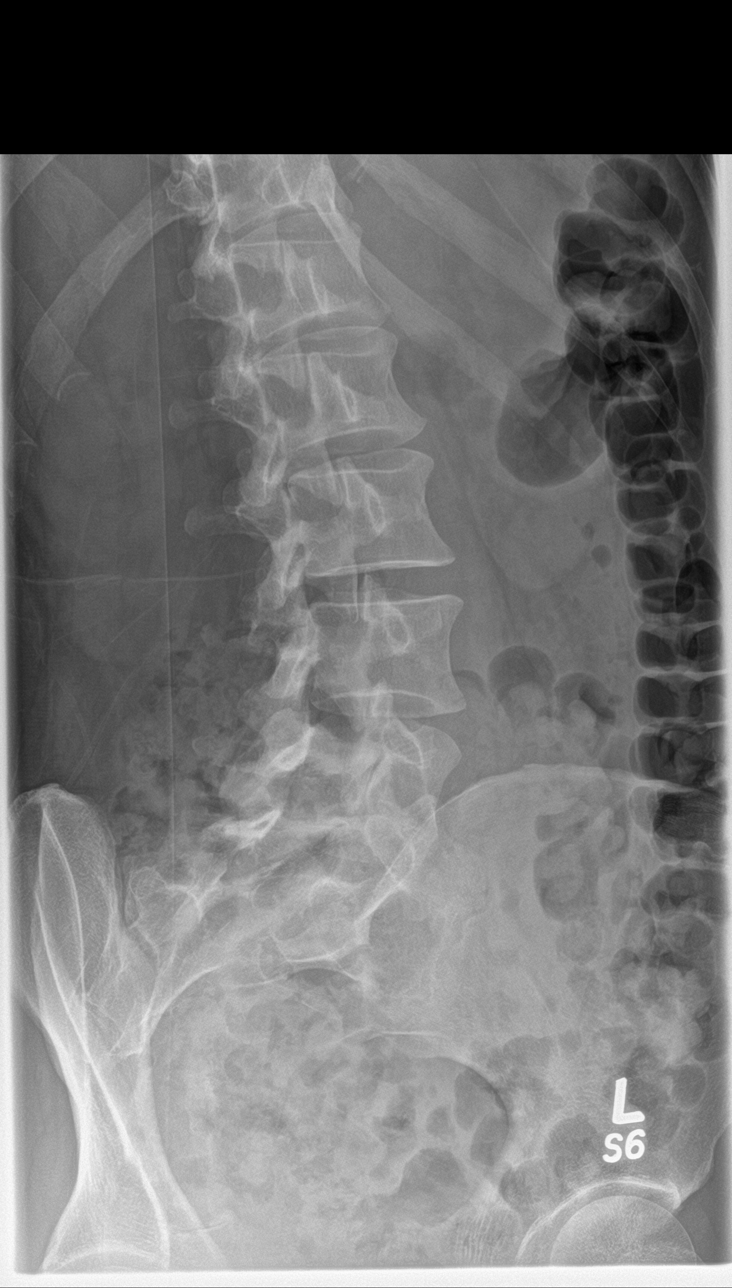

[l-spine lat]
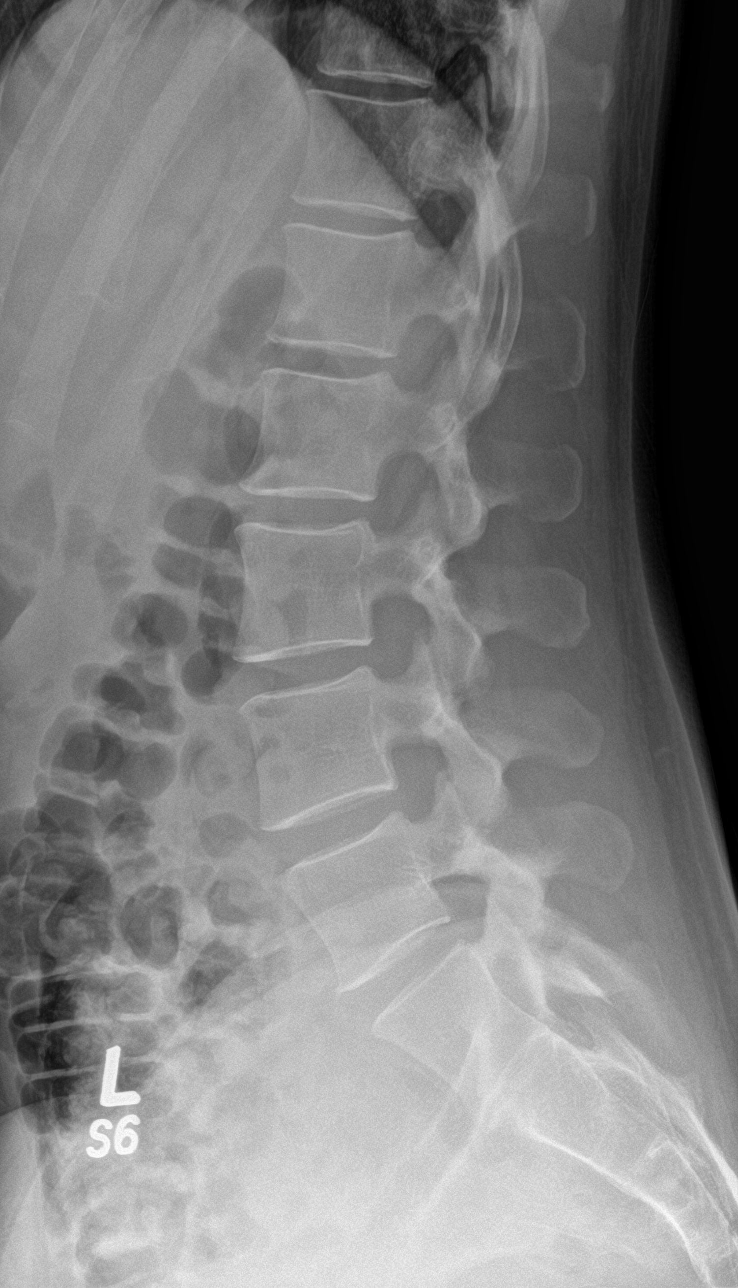

[l-spine spot]
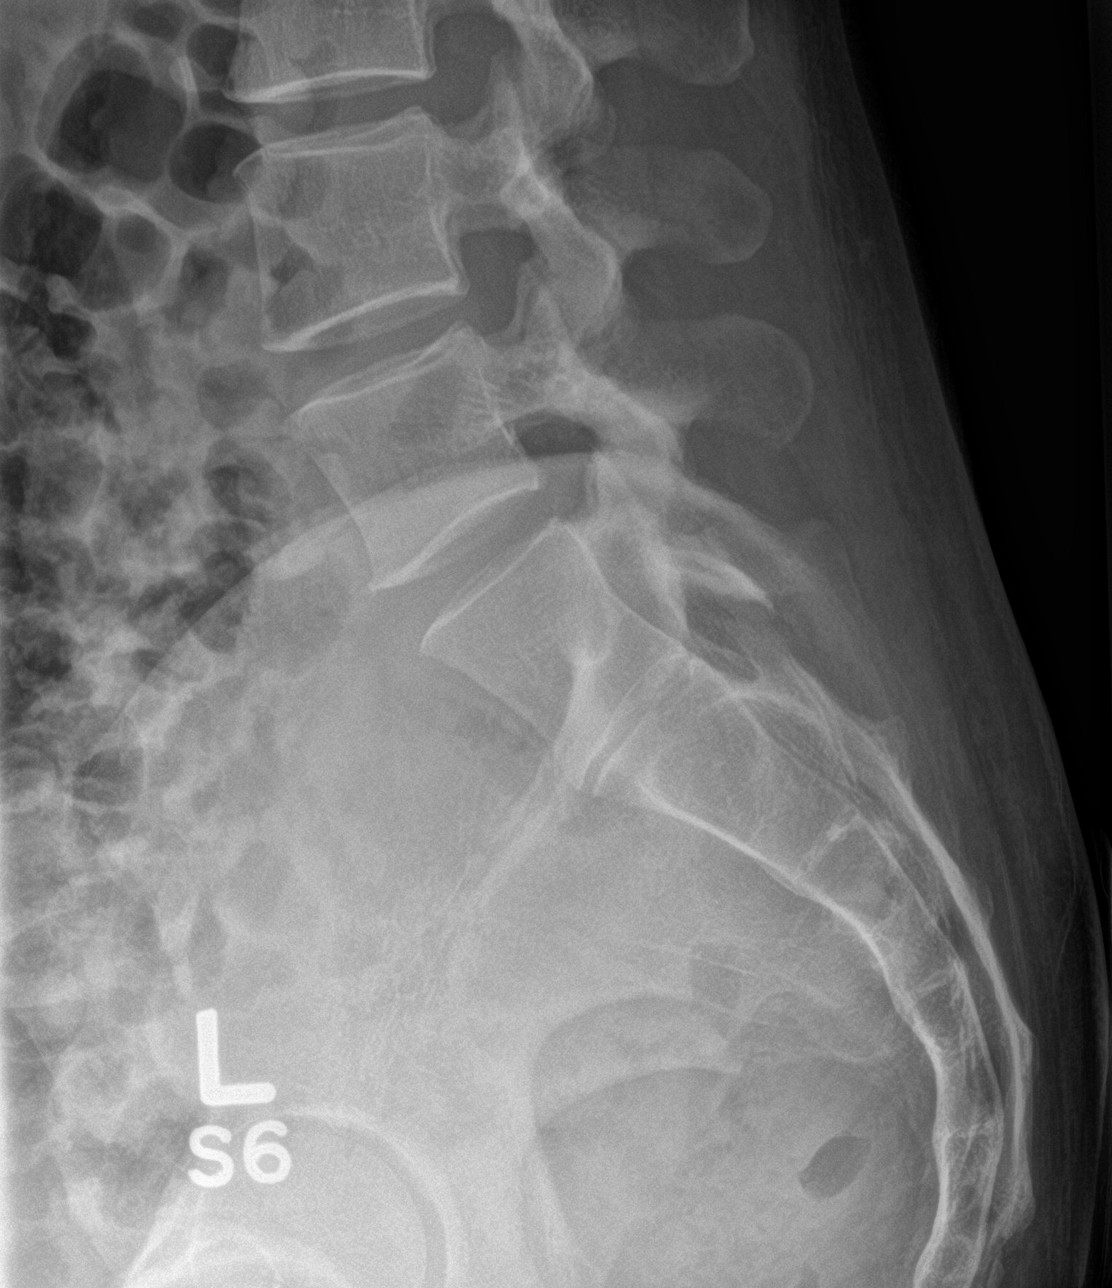

[5 of 5 positions shown; findings below may reference images not displayed]

FINDINGS: There is no evidence of lumbar spine fracture. Alignment is normal.
Intervertebral disc spaces are maintained.
IMPRESSION: Negative.

## 2019-12-02 ENCOUNTER — Ambulatory Visit: Payer: Self-pay | Attending: Internal Medicine

## 2019-12-02 DIAGNOSIS — Z23 Encounter for immunization: Secondary | ICD-10-CM

## 2019-12-02 NOTE — Progress Notes (Signed)
   Covid-19 Vaccination Clinic  Name:  Janeika Parrot    MRN: [id number] DOB: June 01, 1981  12/02/2019  Ms. Aranas was observed post Covid-19 immunization for 15 minutes without incident. She was provided with Vaccine Information Sheet and instruction to access the V-Safe system.   Ms. Karnatz was instructed to call 911 with any severe reactions post vaccine: Marland Kitchen Difficulty breathing  . Swelling of face and throat  . A fast heartbeat  . A bad rash all over body  . Dizziness and weakness   Immunizations Administered    Name Date Dose VIS Date Route   Pfizer COVID-19 Vaccine 12/02/2019  1:30 PM 0.3 mL 08/09/2019 Intramuscular   Manufacturer: [place]   Lot: [id number]   [place]: [id number]

## 2019-12-25 ENCOUNTER — Ambulatory Visit: Payer: Self-pay
